# Patient Record
Sex: Male | Born: 1960
Health system: Southern US, Community
[De-identification: ages and names within clinical notes are randomized; demographics above are authoritative.]

## PROBLEM LIST (undated history)

## (undated) DIAGNOSIS — L0233 Carbuncle of buttock: Secondary | ICD-10-CM

## (undated) DIAGNOSIS — L0591 Pilonidal cyst without abscess: Secondary | ICD-10-CM

## (undated) DIAGNOSIS — F988 Other specified behavioral and emotional disorders with onset usually occurring in childhood and adolescence: Secondary | ICD-10-CM

## (undated) DIAGNOSIS — J45909 Unspecified asthma, uncomplicated: Secondary | ICD-10-CM

## (undated) DIAGNOSIS — Z87442 Personal history of urinary calculi: Secondary | ICD-10-CM

## (undated) DIAGNOSIS — R1013 Epigastric pain: Secondary | ICD-10-CM

## (undated) DIAGNOSIS — Z8679 Personal history of other diseases of the circulatory system: Secondary | ICD-10-CM

## (undated) DIAGNOSIS — L723 Sebaceous cyst: Secondary | ICD-10-CM

## (undated) DIAGNOSIS — K3189 Other diseases of stomach and duodenum: Secondary | ICD-10-CM

## (undated) DIAGNOSIS — M109 Gout, unspecified: Secondary | ICD-10-CM

## (undated) DIAGNOSIS — J309 Allergic rhinitis, unspecified: Secondary | ICD-10-CM

## (undated) DIAGNOSIS — I10 Essential (primary) hypertension: Secondary | ICD-10-CM

## (undated) DIAGNOSIS — J069 Acute upper respiratory infection, unspecified: Secondary | ICD-10-CM

## (undated) HISTORY — DX: Acute upper respiratory infection, unspecified: J06.9

## (undated) HISTORY — DX: Carbuncle of buttock: L02.33

## (undated) HISTORY — PX: OTHER SURGICAL HISTORY: SHX169

## (undated) HISTORY — DX: Personal history of other diseases of the circulatory system: Z86.79

## (undated) HISTORY — DX: Epigastric pain: R10.13

## (undated) HISTORY — DX: Allergic rhinitis, unspecified: J30.9

## (undated) HISTORY — DX: Gout, unspecified: M10.9

## (undated) HISTORY — PX: QUADRICEPS TENDON REPAIR: SHX756

## (undated) HISTORY — DX: Other specified behavioral and emotional disorders with onset usually occurring in childhood and adolescence: F98.8

## (undated) HISTORY — DX: Unspecified asthma, uncomplicated: J45.909

## (undated) HISTORY — DX: Personal history of urinary calculi: Z87.442

## (undated) HISTORY — DX: Essential (primary) hypertension: I10

## (undated) HISTORY — DX: Pilonidal cyst without abscess: L05.91

## (undated) HISTORY — DX: Other diseases of stomach and duodenum: K31.89

## (undated) HISTORY — DX: Sebaceous cyst: L72.3

---

## 1999-10-07 ENCOUNTER — Encounter (INDEPENDENT_AMBULATORY_CARE_PROVIDER_SITE_OTHER): Payer: Self-pay | Admitting: Specialist

## 1999-10-07 ENCOUNTER — Other Ambulatory Visit: Admission: RE | Admit: 1999-10-07 | Discharge: 1999-10-07 | Payer: Self-pay | Admitting: *Deleted

## 2001-03-29 ENCOUNTER — Ambulatory Visit (HOSPITAL_COMMUNITY): Admission: RE | Admit: 2001-03-29 | Discharge: 2001-03-29 | Payer: Self-pay | Admitting: Urology

## 2001-03-29 ENCOUNTER — Encounter: Payer: Self-pay | Admitting: Urology

## 2001-04-09 ENCOUNTER — Ambulatory Visit (HOSPITAL_COMMUNITY): Admission: RE | Admit: 2001-04-09 | Discharge: 2001-04-09 | Payer: Self-pay | Admitting: Urology

## 2001-06-11 ENCOUNTER — Emergency Department (HOSPITAL_COMMUNITY): Admission: EM | Admit: 2001-06-11 | Discharge: 2001-06-11 | Payer: Self-pay | Admitting: Emergency Medicine

## 2002-10-06 ENCOUNTER — Encounter: Payer: Self-pay | Admitting: Orthopedic Surgery

## 2002-10-06 ENCOUNTER — Emergency Department (HOSPITAL_COMMUNITY): Admission: EM | Admit: 2002-10-06 | Discharge: 2002-10-06 | Payer: Self-pay | Admitting: Emergency Medicine

## 2002-10-06 ENCOUNTER — Encounter: Payer: Self-pay | Admitting: Emergency Medicine

## 2004-02-27 ENCOUNTER — Ambulatory Visit: Payer: Self-pay | Admitting: Family Medicine

## 2004-03-29 ENCOUNTER — Ambulatory Visit: Payer: Self-pay | Admitting: Family Medicine

## 2004-04-12 ENCOUNTER — Ambulatory Visit: Payer: Self-pay | Admitting: Family Medicine

## 2004-07-28 ENCOUNTER — Emergency Department (HOSPITAL_COMMUNITY): Admission: EM | Admit: 2004-07-28 | Discharge: 2004-07-28 | Payer: Self-pay | Admitting: Emergency Medicine

## 2004-07-29 ENCOUNTER — Ambulatory Visit (HOSPITAL_BASED_OUTPATIENT_CLINIC_OR_DEPARTMENT_OTHER): Admission: RE | Admit: 2004-07-29 | Discharge: 2004-07-29 | Payer: Self-pay | Admitting: Urology

## 2005-07-22 ENCOUNTER — Ambulatory Visit: Payer: Self-pay | Admitting: Family Medicine

## 2005-07-27 ENCOUNTER — Ambulatory Visit: Payer: Self-pay | Admitting: Family Medicine

## 2006-09-11 DIAGNOSIS — J309 Allergic rhinitis, unspecified: Secondary | ICD-10-CM | POA: Insufficient documentation

## 2006-09-11 DIAGNOSIS — M109 Gout, unspecified: Secondary | ICD-10-CM | POA: Insufficient documentation

## 2006-09-11 DIAGNOSIS — I10 Essential (primary) hypertension: Secondary | ICD-10-CM

## 2006-09-11 HISTORY — DX: Gout, unspecified: M10.9

## 2006-09-11 HISTORY — DX: Allergic rhinitis, unspecified: J30.9

## 2006-09-11 HISTORY — DX: Essential (primary) hypertension: I10

## 2006-09-28 DIAGNOSIS — L723 Sebaceous cyst: Secondary | ICD-10-CM

## 2006-09-28 HISTORY — DX: Sebaceous cyst: L72.3

## 2006-10-02 ENCOUNTER — Ambulatory Visit: Payer: Self-pay | Admitting: Family Medicine

## 2006-10-12 ENCOUNTER — Ambulatory Visit: Payer: Self-pay | Admitting: Family Medicine

## 2006-10-12 LAB — CONVERTED CEMR LAB
Alkaline Phosphatase: 67 units/L (ref 39–117)
BUN: 23 mg/dL (ref 6–23)
Basophils Absolute: 0 10*3/uL (ref 0.0–0.1)
Basophils Relative: 0.1 % (ref 0.0–1.0)
Bilirubin Urine: NEGATIVE
CO2: 31 meq/L (ref 19–32)
Cholesterol: 160 mg/dL (ref 0–200)
Creatinine, Ser: 1.2 mg/dL (ref 0.4–1.5)
GFR calc Af Amer: 84 mL/min
Glucose, Urine, Semiquant: NEGATIVE
HCT: 42.9 % (ref 39.0–52.0)
HDL: 34.8 mg/dL — ABNORMAL LOW (ref >39.0)
Hemoglobin: 15.2 g/dL (ref 13.0–17.0)
Ketones, urine, test strip: NEGATIVE
Lymphocytes Relative: 22.6 % (ref 12.0–46.0)
MCHC: 35.5 g/dL (ref 30.0–36.0)
Monocytes Absolute: 0.5 10*3/uL (ref 0.2–0.7)
Monocytes Relative: 7.3 % (ref 3.0–11.0)
Neutro Abs: 4.6 10*3/uL (ref 1.4–7.7)
Neutrophils Relative %: 66.7 % (ref 43.0–77.0)
Nitrite: NEGATIVE
Potassium: 4.9 meq/L (ref 3.5–5.1)
Protein, U semiquant: NEGATIVE
RDW: 11.7 % (ref 11.5–14.6)
Sodium: 141 meq/L (ref 135–145)
Specific Gravity, Urine: 1.025
TSH: 0.98 microintl units/mL (ref 0.35–5.50)
Total Bilirubin: 2.1 mg/dL — ABNORMAL HIGH (ref 0.3–1.2)
Total Protein: 6.6 g/dL (ref 6.0–8.3)
Urobilinogen, UA: 0.2
WBC Urine, dipstick: NEGATIVE
pH: 5.5

## 2006-11-20 DIAGNOSIS — L0233 Carbuncle of buttock: Secondary | ICD-10-CM | POA: Insufficient documentation

## 2006-11-20 HISTORY — DX: Carbuncle of buttock: L02.33

## 2006-11-27 ENCOUNTER — Ambulatory Visit: Payer: Self-pay | Admitting: Family Medicine

## 2007-12-24 ENCOUNTER — Telehealth: Payer: Self-pay | Admitting: Family Medicine

## 2008-01-05 DIAGNOSIS — J069 Acute upper respiratory infection, unspecified: Secondary | ICD-10-CM | POA: Insufficient documentation

## 2008-01-05 HISTORY — DX: Acute upper respiratory infection, unspecified: J06.9

## 2008-01-10 ENCOUNTER — Ambulatory Visit: Payer: Self-pay | Admitting: Family Medicine

## 2008-01-10 DIAGNOSIS — J45909 Unspecified asthma, uncomplicated: Secondary | ICD-10-CM | POA: Insufficient documentation

## 2008-01-10 HISTORY — DX: Unspecified asthma, uncomplicated: J45.909

## 2008-01-24 ENCOUNTER — Ambulatory Visit: Payer: Self-pay | Admitting: Family Medicine

## 2008-01-24 LAB — CONVERTED CEMR LAB
ALT: 34 units/L (ref 0–53)
AST: 24 units/L (ref 0–37)
Albumin: 4 g/dL (ref 3.5–5.2)
BUN: 29 mg/dL — ABNORMAL HIGH (ref 6–23)
Basophils Relative: 0.1 % (ref 0.0–3.0)
Bilirubin Urine: NEGATIVE
Calcium: 10.1 mg/dL (ref 8.4–10.5)
Chloride: 100 meq/L (ref 96–112)
Cholesterol: 195 mg/dL (ref 0–200)
Creatinine, Ser: 1.3 mg/dL (ref 0.4–1.5)
Eosinophils Absolute: 0 10*3/uL (ref 0.0–0.7)
Eosinophils Relative: 0.1 % (ref 0.0–5.0)
GFR calc non Af Amer: 63 mL/min
Glucose, Urine, Semiquant: NEGATIVE
HCT: 44.9 % (ref 39.0–52.0)
Hemoglobin: 15.7 g/dL (ref 13.0–17.0)
MCHC: 34.9 g/dL (ref 30.0–36.0)
MCV: 92.2 fL (ref 78.0–100.0)
Neutro Abs: 8.8 10*3/uL — ABNORMAL HIGH (ref 1.4–7.7)
Neutrophils Relative %: 80.4 % — ABNORMAL HIGH (ref 43.0–77.0)
RBC: 4.87 M/uL (ref 4.22–5.81)
Specific Gravity, Urine: 1.02
TSH: 0.66 microintl units/mL (ref 0.35–5.50)
WBC Urine, dipstick: NEGATIVE
WBC: 10.9 10*3/uL — ABNORMAL HIGH (ref 4.5–10.5)
pH: 5.5

## 2008-01-31 ENCOUNTER — Ambulatory Visit: Payer: Self-pay | Admitting: Family Medicine

## 2008-01-31 ENCOUNTER — Telehealth: Payer: Self-pay | Admitting: Family Medicine

## 2008-01-31 DIAGNOSIS — L0591 Pilonidal cyst without abscess: Secondary | ICD-10-CM | POA: Insufficient documentation

## 2008-01-31 DIAGNOSIS — Z87442 Personal history of urinary calculi: Secondary | ICD-10-CM

## 2008-01-31 HISTORY — DX: Pilonidal cyst without abscess: L05.91

## 2008-01-31 HISTORY — DX: Personal history of urinary calculi: Z87.442

## 2008-03-20 ENCOUNTER — Observation Stay (HOSPITAL_COMMUNITY): Admission: EM | Admit: 2008-03-20 | Discharge: 2008-03-20 | Payer: Self-pay | Admitting: Emergency Medicine

## 2008-03-21 ENCOUNTER — Ambulatory Visit: Payer: Self-pay | Admitting: Internal Medicine

## 2008-03-21 DIAGNOSIS — Z8679 Personal history of other diseases of the circulatory system: Secondary | ICD-10-CM | POA: Insufficient documentation

## 2008-03-21 DIAGNOSIS — K3189 Other diseases of stomach and duodenum: Secondary | ICD-10-CM

## 2008-03-21 DIAGNOSIS — R1013 Epigastric pain: Secondary | ICD-10-CM

## 2008-03-21 HISTORY — DX: Personal history of other diseases of the circulatory system: Z86.79

## 2008-03-21 HISTORY — DX: Other diseases of stomach and duodenum: K31.89

## 2008-03-24 ENCOUNTER — Telehealth (INDEPENDENT_AMBULATORY_CARE_PROVIDER_SITE_OTHER): Payer: Self-pay

## 2008-03-25 ENCOUNTER — Ambulatory Visit: Payer: Self-pay

## 2008-03-25 ENCOUNTER — Encounter: Payer: Self-pay | Admitting: Cardiology

## 2008-10-14 ENCOUNTER — Telehealth: Payer: Self-pay | Admitting: Family Medicine

## 2008-11-05 ENCOUNTER — Telehealth: Payer: Self-pay | Admitting: Family Medicine

## 2008-12-09 ENCOUNTER — Ambulatory Visit: Payer: Self-pay | Admitting: Family Medicine

## 2008-12-09 DIAGNOSIS — F988 Other specified behavioral and emotional disorders with onset usually occurring in childhood and adolescence: Secondary | ICD-10-CM

## 2008-12-09 HISTORY — DX: Other specified behavioral and emotional disorders with onset usually occurring in childhood and adolescence: F98.8

## 2009-02-24 ENCOUNTER — Telehealth: Payer: Self-pay | Admitting: Family Medicine

## 2009-11-03 ENCOUNTER — Telehealth: Payer: Self-pay | Admitting: Family Medicine

## 2009-12-08 ENCOUNTER — Ambulatory Visit: Payer: Self-pay | Admitting: Family Medicine

## 2009-12-08 LAB — CONVERTED CEMR LAB
Albumin: 3.6 g/dL (ref 3.5–5.2)
Alkaline Phosphatase: 65 units/L (ref 39–117)
Basophils Relative: 0.9 % (ref 0.0–3.0)
CO2: 26 meq/L (ref 19–32)
Chloride: 106 meq/L (ref 96–112)
Eosinophils Absolute: 0.3 10*3/uL (ref 0.0–0.7)
Glucose, Urine, Semiquant: NEGATIVE
HCT: 42.8 % (ref 39.0–52.0)
Hemoglobin: 14.9 g/dL (ref 13.0–17.0)
Ketones, urine, test strip: NEGATIVE
MCHC: 34.8 g/dL (ref 30.0–36.0)
MCV: 92.2 fL (ref 78.0–100.0)
Monocytes Absolute: 0.3 10*3/uL (ref 0.1–1.0)
Neutro Abs: 2 10*3/uL (ref 1.4–7.7)
Nitrite: NEGATIVE
RBC: 4.64 M/uL (ref 4.22–5.81)
Sodium: 140 meq/L (ref 135–145)
Specific Gravity, Urine: >=1.03
Total CHOL/HDL Ratio: 4
Total Protein: 6 g/dL (ref 6.0–8.3)
WBC Urine, dipstick: NEGATIVE

## 2009-12-15 ENCOUNTER — Ambulatory Visit: Payer: Self-pay | Admitting: Family Medicine

## 2009-12-15 ENCOUNTER — Encounter: Payer: Self-pay | Admitting: Family Medicine

## 2010-02-18 NOTE — Progress Notes (Signed)
Summary: Zachary Gutierrez req script for Ritalin  Phone Note Call from Patient Call back at Work Phone [phone] Call back at [phone] cell   Caller: Patient Summary of Call: Zachary Gutierrez called and said that the trial of Ritalin has really worked and Zachary Gutierrez would like Dr. Tawanna Cooler to write a script for the med. Please call when script is ready for pick up.   Initial call taken by: Lucy Antigua,  February 24, 2009 8:16 AM  Follow-up for Phone Call        ok x 3 mo. Follow-up by: Roderick Pee MD,  February 24, 2009 9:24 AM    New/Updated Medications: RITALIN 10 MG TABS (METHYLPHENIDATE HCL) take one tab two times a day  fill in one month RITALIN 10 MG TABS (METHYLPHENIDATE HCL) take one tab two times a day   fill in two months Prescriptions: RITALIN 10 MG TABS (METHYLPHENIDATE HCL) take one tab two times a day   fill in two months  #60 x 0   Entered by:   Kern Reap CMA (AAMA)   Authorized by:   Roderick Pee MD   Signed by:   Kern Reap CMA (AAMA) on 02/24/2009   Method used:   Print then Give to Patient   RxID:   [id number] RITALIN 10 MG TABS (METHYLPHENIDATE HCL) take one tab two times a day  fill in one month  #60 x 0   Entered by:   Kern Reap CMA (AAMA)   Authorized by:   Roderick Pee MD   Signed by:   Kern Reap CMA (AAMA) on 02/24/2009   Method used:   Print then Give to Patient   RxID:   [id number] RITALIN 10 MG TABS (METHYLPHENIDATE HCL) Take 1 tablet by mouth two times a day  #60 x 0   Entered by:   Kern Reap CMA (AAMA)   Authorized by:   Roderick Pee MD   Signed by:   Kern Reap CMA (AAMA) on 02/24/2009   Method used:   Print then Give to Patient   RxID:   [id number]

## 2010-02-18 NOTE — Progress Notes (Signed)
Summary: Rx Refill  Phone Note Refill Request Message from:  Patient on November 03, 2009 3:19 PM  Refills Requested: Medication #1:  HYZAAR 50-12.5 MG TABS take one tablet in the morning  Medication #2:  RITALIN 10 MG TABS Take 1 tablet by mouth two times a day Pt will pick up ritalin when available, please send Hyzaar to [facility].   Method Requested: Electronic Initial call taken by: Trixie Dredge,  November 03, 2009 3:19 PM    Prescriptions: RITALIN 10 MG TABS (METHYLPHENIDATE HCL) take one tab two times a day   fill in two months  #60 x 0   Entered by:   Kern Reap CMA (AAMA)   Authorized by:   Roderick Pee MD   Signed by:   Kern Reap CMA (AAMA) on 11/04/2009   Method used:   Print then Give to Patient   RxID:   [id number] RITALIN 10 MG TABS (METHYLPHENIDATE HCL) take one tab two times a day  fill in one month  #60 x 0   Entered by:   Kern Reap CMA (AAMA)   Authorized by:   Roderick Pee MD   Signed by:   Kern Reap CMA (AAMA) on 11/04/2009   Method used:   Print then Give to Patient   RxID:   [id number] RITALIN 10 MG TABS (METHYLPHENIDATE HCL) Take 1 tablet by mouth two times a day  #60 x 0   Entered by:   Kern Reap CMA (AAMA)   Authorized by:   Roderick Pee MD   Signed by:   Kern Reap CMA (AAMA) on 11/04/2009   Method used:   Print then Give to Patient   RxID:   [id number] WNUUVO 50-12.5 MG TABS (LOSARTAN POTASSIUM-HCTZ) take one tablet in the morning  #100 x 2   Entered by:   Kern Reap CMA (AAMA)   Authorized by:   Roderick Pee MD   Signed by:   Kern Reap CMA (AAMA) on 11/04/2009   Method used:   Electronically to        [facility] [phone]* (retail)       [address]       [place], [place]  [address]       Ph: [phone] or [phone]       Fax: [phone]   RxID:   [id number]

## 2010-02-18 NOTE — Assessment & Plan Note (Signed)
Summary: CPX//SLM   Vital Signs:  Patient profile:   50 year old male Height:      64.25 inches Weight:      152 pounds BMI:     25.98 Temp:     98.7 degrees F oral BP sitting:   126 / 86  (left arm) Cuff size:   regular  Vitals Entered By: Kern Reap CMA Duncan Dull) (December 15, 2009 9:41 AM) CC: cpx   CC:  cpx.  History of Present Illness: Zachary Gutierrez is a 50 year old, married male, nonsmoker, attorney, who comes in today for general physical examination because of a history of underlying gout, allergic rhinitis, hypertension, and mild ADD.  He takes an 81-mg baby aspirin daily and is not taking his allopurinol.  He has a history of elevated uric acid and kidney stones.  Advised to take the allopurinol daily.  He takes Allegra for allergic rhinitis.  Hyzaar 50 -- 12.5 daily for hypertension.  BP 126/86.  He also takes Ritalin 10 mg b.i.d. for mild ADDNS>  Re.  He gets routine eye care and dental care.  Tetanus 2004 seasonal flu shot 2010.  He continues to remain physically active, playing soccer  Allergies: No Known Drug Allergies  Past History:  Past medical, surgical, family and social histories (including risk factors) reviewed, and no changes noted (except as noted below).  Past Medical History: Reviewed history from 01/31/2008 and no changes required. Allergic rhinitis Gout Hypertension Nephrolithiasis, hx of  Past Surgical History: Reviewed history from 09/11/2006 and no changes required. nephrolithiasis  Family History: Reviewed history from 11/27/2006 and no changes required. Family History Hypertension  Social History: Reviewed history from 11/27/2006 and no changes required. Occupation:  Clinical research associate sports  Married Never Smoked Alcohol use-yes Drug use-no Regular exercise-yes  Review of Systems      See HPI  Physical Exam  General:  Well-developed,well-nourished,in no acute distress; alert,appropriate and cooperative throughout examination Head:   Normocephalic and atraumatic without obvious abnormalities. No apparent alopecia or balding. Eyes:  No corneal or conjunctival inflammation noted. EOMI. Perrla. Funduscopic exam benign, without hemorrhages, exudates or papilledema. Vision grossly normal. Ears:  External ear exam shows no significant lesions or deformities.  Otoscopic examination reveals clear canals, tympanic membranes are intact bilaterally without bulging, retraction, inflammation or discharge. Hearing is grossly normal bilaterally. Nose:  External nasal examination shows no deformity or inflammation. Nasal mucosa are pink and moist without lesions or exudates. Mouth:  Oral mucosa and oropharynx without lesions or exudates.  Teeth in good repair. Neck:  No deformities, masses, or tenderness noted. Chest Wall:  No deformities, masses, tenderness or gynecomastia noted. Breasts:  No masses or gynecomastia noted Lungs:  Normal respiratory effort, chest expands symmetrically. Lungs are clear to auscultation, no crackles or wheezes. Heart:  Normal rate and regular rhythm. S1 and S2 normal without gallop, murmur, click, rub or other extra sounds. Abdomen:  Bowel sounds positive,abdomen soft and non-tender without masses, organomegaly or hernias noted. Rectal:  No external abnormalities noted. Normal sphincter tone. No rectal masses or tenderness. Genitalia:  Testes bilaterally descended without nodularity, tenderness or masses. No scrotal masses or lesions. No penis lesions or urethral discharge. Prostate:  Prostate gland firm and smooth, no enlargement, nodularity, tenderness, mass, asymmetry or induration. Msk:  No deformity or scoliosis noted of thoracic or lumbar spine.   Pulses:  R and L carotid,radial,femoral,dorsalis pedis and posterior tibial pulses are full and equal bilaterally Extremities:  No clubbing, cyanosis, edema, or deformity noted with normal  full range of motion of all joints.   Neurologic:  No cranial nerve deficits  noted. Station and gait are normal. Plantar reflexes are down-going bilaterally. DTRs are symmetrical throughout. Sensory, motor and coordinative functions appear intact. Skin:  Intact without suspicious lesions or rashes Cervical Nodes:  No lymphadenopathy noted Axillary Nodes:  No palpable lymphadenopathy Inguinal Nodes:  No significant adenopathy Psych:  Cognition and judgment appear intact. Alert and cooperative with normal attention span and concentration. No apparent delusions, illusions, hallucinations   Impression & Recommendations:  Problem # 1:  ATTENTION DEFICIT DISORDER, ADULT (ICD-314.00) Assessment Improved  Orders: Prescription Created Electronically [phone])  Problem # 2:  HEALTH SCREENING (ICD-V70.0) Assessment: Unchanged  Orders: Prescription Created Electronically [phone]) EKG w/ Interpretation (93000)  Problem # 3:  HYPERTENSION (ICD-401.9) Assessment: Improved  His updated medication list for this problem includes:    Hyzaar 50-12.5 Mg Tabs (Losartan potassium-hctz) .Marland Kitchen... Take one tablet in the morning  Orders: Prescription Created Electronically [phone]) EKG w/ Interpretation (93000)  Problem # 4:  GOUT (ICD-274.9) Assessment: Improved  His updated medication list for this problem includes:    Allopurinol 300 Mg Tabs (Allopurinol) .Marland Kitchen... Take one every morning  Orders: Prescription Created Electronically [phone])  Complete Medication List: 1)  Aspirin 81 Mg Tbec (Aspirin) .... Once daily 2)  Allopurinol 300 Mg Tabs (Allopurinol) .... Take one every morning 3)  Fexofenadine Hcl 180 Mg Tabs (Fexofenadine hcl) .... As needed 4)  Hyzaar 50-12.5 Mg Tabs (Losartan potassium-hctz) .... Take one tablet in the morning 5)  Ritalin 10 Mg Tabs (Methylphenidate hcl) .... Take 1 tablet by mouth two times a day 6)  Ritalin 10 Mg Tabs (Methylphenidate hcl) .... Take one tab two times a day  fill in one month 7)  Ritalin 10 Mg Tabs (Methylphenidate hcl) .... Take one  tab two times a day   fill in two months  Patient Instructions: 1)  Please schedule a follow-up appointment in 1 year. 2)  It is important that you exercise regularly at least 20 minutes 5 times a week. If you develop chest pain, have severe difficulty breathing, or feel very tired , stop exercising immediately and seek medical attention. 3)  Take an Aspirin every day. Prescriptions: HYZAAR 50-12.5 MG TABS (LOSARTAN POTASSIUM-HCTZ) take one tablet in the morning  #100 x 2   Entered and Authorized by:   Roderick Pee MD   Signed by:   Roderick Pee MD on 12/15/2009   Method used:   Electronically to        [facility] [phone]* (retail)       [address]       [place], [place]  [address]       Ph: [phone] or [phone]       Fax: [phone]   RxID:   [id number] ALLOPURINOL 300 MG  TABS (ALLOPURINOL) take one every morning  #100 x 3   Entered and Authorized by:   Roderick Pee MD   Signed by:   Roderick Pee MD on 12/15/2009   Method used:   Electronically to        [facility] [address] [phone]* (retail)       [address]       [place], [place]  [address]       Ph: [phone] or [phone]  Fax: [phone]   RxID:   [id number]    Orders Added: 1)  Prescription Created Electronically [G8553] 2)  Est. Patient 40-64 years [84696] 3)  EKG w/ Interpretation [93000]

## 2010-04-29 LAB — DIFFERENTIAL
Eosinophils Relative: 3 % (ref 0–5)
Lymphocytes Relative: 36 % (ref 12–46)
Lymphs Abs: 2.3 10*3/uL (ref 0.7–4.0)
Monocytes Relative: 8 % (ref 3–12)

## 2010-04-29 LAB — URINE MICROSCOPIC-ADD ON

## 2010-04-29 LAB — CBC
HCT: 42.5 % (ref 39.0–52.0)
Hemoglobin: 14.9 g/dL (ref 13.0–17.0)
MCHC: 35 g/dL (ref 30.0–36.0)
MCV: 91.9 fL (ref 78.0–100.0)
RDW: 12.6 % (ref 11.5–15.5)

## 2010-04-29 LAB — TROPONIN I
Troponin I: <0.01 ng/mL (ref 0.00–0.06)
Troponin I: <0.01 ng/mL (ref 0.00–0.06)

## 2010-04-29 LAB — CK TOTAL AND CKMB (NOT AT ARMC)
Relative Index: 1.1 (ref 0.0–2.5)
Relative Index: 1.1 (ref 0.0–2.5)

## 2010-04-29 LAB — URINALYSIS, ROUTINE W REFLEX MICROSCOPIC
Protein, ur: NEGATIVE mg/dL
Urobilinogen, UA: 0.2 mg/dL (ref 0.0–1.0)

## 2010-04-29 LAB — BASIC METABOLIC PANEL
CO2: 22 mEq/L (ref 19–32)
Chloride: 104 mEq/L (ref 96–112)
Glucose, Bld: 106 mg/dL — ABNORMAL HIGH (ref 70–99)
Potassium: 3.5 mEq/L (ref 3.5–5.1)
Sodium: 139 mEq/L (ref 135–145)

## 2010-06-04 NOTE — Op Note (Signed)
[facility]  Patient:    [name], Zachary Gutierrez Visit Number: [id number] MRN: [id number]          Service Type: DSU Location: DAY Attending Physician:  Lauree Chandler Dictated by:   Maretta Bees. Vonita Moss, M.D. Proc. Date: 04/09/01 Admit Date:  04/09/2001                             Operative Report  PREOPERATIVE DIAGNOSIS:  Right ureteral calculus.  POSTOPERATIVE DIAGNOSIS:  Right ureteral calculus.  PROCEDURE: 1. Cystoscopy. 2. Right ureteroscopy and stone manipulation.  SURGEON:  Maretta Bees. Vonita Moss, M.D.  ANESTHESIA:  General.  INDICATIONS:  This 50 year old white male has had two or three weeks of intermittent right flank pain that has been very severe at times. He was scheduled for lithotripsy last week after he flew back from [place] with severe pain and at the time of the lithotripsy, a KUB did not visualize the stone. We could not tell whether the stone was gone but in retrospect, it was probably over the sacroiliac area. He had the onset yesterday, March 223, of recurrent severe right flank pain, increased urge to void and a KUB in the office this morning showed the stone to be 4 mm in size in the vicinity to the distal right ureter. In view of the pain and inconvenience and persistent clinical course, he opted to go ahead with stone manipulation tonight.  DESCRIPTION OF PROCEDURE:  The patient was brought to the operating room and placed in lithotomy position. External genitalia were prepped and draped in the usual fashion. He was cystoscoped and the stone was right at the right ureteral orifice. A guidewire was placed up the right ureter without difficulty. I then inserted the ureteroscope and the stone was manipulated out of the distal ureter and subsequently given to the patient. The distal ureter was in good condition with no residual stone fragments and no concern about any harm or injury to the ureter and therefore, a Double J  catheter was not left indwelling. The guidewire was removed. The patient was taken to the recovery room in good condition having tolerated the procedure well. Dictated by:   Maretta Bees. Vonita Moss, M.D. Attending Physician:  Lauree Chandler DD:  04/09/01 TD:  04/09/01 Job: [id number]

## 2010-06-04 NOTE — Op Note (Signed)
NAME:  ARAGON, SCARANTINO               ACCOUNT NO.:  [id number]   MEDICAL RECORD NO.:  [id number]          PATIENT TYPE:  AMB   LOCATION:  [facility]                         FACILITY:  [facility]   PHYSICIAN:  Maretta Bees. Vonita Moss, M.D.DATE OF BIRTH:  1960-08-19   DATE OF PROCEDURE:  07/29/2004  DATE OF DISCHARGE:                                 OPERATIVE REPORT   PREOPERATIVE DIAGNOSIS:  Right ureteral calculus.   POSTOPERATIVE DIAGNOSIS:  Right ureteral calculus, bladder calculi.   PROCEDURE:  Cystoscopy, right retrograde pyelogram with interpretation,  right ureteroscopic stone basketing and laser fragmentation and insertion of  right double-J catheter.   SURGEON:  Dr. Larey Dresser.   ANESTHESIA:  General.   INDICATIONS:  This 50 year old gentleman with known stone disease has passed  multiple small pieces of gravel in the last few days but has had fairly  severe persistent right flank pain and a CT scan showed a 4 mm stone in the  right upper ureter. He is brought to the OR today for relief of all these  symptoms.   DESCRIPTION OF PROCEDURE:  The patient was brought to the operating room,  placed in lithotomy position. The external genitalia were prepped and draped  in the usual fashion. He was cystoscoped,  the anterior and prostatic  urethra was unremarkable. The bladder had a lot of fine yellow stony gravel  and also some right ureteral orifice on the right. There were no bladder  tumors or other lesions. The guidewire was placed up the right ureter and  would not go beyond the stone that was located at the level of L3. I then  inserted an open-ended ureteral catheter and then a Glidewire was able to be  manipulated beyond the stone and a retrograde pyelogram was obtained. Before  I slipped up the Glidewire, it showed a filling defect consistent with a  uric acid stone. He has known uric acid stone disease. With the metal  guidewire now beyond the stone, I inserted a 6-French  ureteroscope and  easily manipulated up to the stone which was somewhat impacted and too big  to basket. I then used the holmium laser and broke it into multiple small  pieces and basketed out a couple of the larger pieces, the rest were too  small to engage in the basket.   Retrograde pyelogram was then performed showing mild pyelocaliectasis and no  evidence of extravasation. The guidewire was reinserted into the ureter and  over that a 6 French 26 cm double-J catheter was placed with a full coil in  the upper pole calix and a full coil in the bladder and a string brought out  per urethra and taped to the penis. The patient was then taken to the  recovery room in good condition having tolerated the procedure well.     _________________    LJP/MEDQ  D:  07/29/2004  T:  07/29/2004  Job:  [id number]

## 2011-02-28 ENCOUNTER — Telehealth: Payer: Self-pay | Admitting: Family Medicine

## 2011-02-28 MED ORDER — LOSARTAN POTASSIUM-HCTZ 50-12.5 MG PO TABS: 1.0000 | ORAL_TABLET | Freq: Every day | ORAL | Status: DC

## 2011-02-28 NOTE — Telephone Encounter (Signed)
Pt needs a refill on his Losartan-HCTZ 50-12.5mg . He only has 1 left, but did not realize it. I made his cpx appt for 04/25/11. He uses the [facility] on [address] Thanks!

## 2011-04-13 ENCOUNTER — Other Ambulatory Visit: Payer: Self-pay

## 2011-04-18 ENCOUNTER — Encounter: Payer: Self-pay | Admitting: Family Medicine

## 2011-04-25 ENCOUNTER — Encounter: Payer: Self-pay | Admitting: Family Medicine

## 2011-05-30 ENCOUNTER — Other Ambulatory Visit (INDEPENDENT_AMBULATORY_CARE_PROVIDER_SITE_OTHER): Payer: BC Managed Care – PPO

## 2011-05-30 DIAGNOSIS — Z0000 Encounter for general adult medical examination without abnormal findings: Secondary | ICD-10-CM

## 2011-05-30 LAB — HEPATIC FUNCTION PANEL
ALT: 53 U/L (ref 0–53)
Alkaline Phosphatase: 73 U/L (ref 39–117)
Bilirubin, Direct: 0.2 mg/dL (ref 0.0–0.3)
Total Protein: 6.7 g/dL (ref 6.0–8.3)

## 2011-05-30 LAB — BASIC METABOLIC PANEL
Calcium: 8.4 mg/dL (ref 8.4–10.5)
Chloride: 99 mEq/L (ref 96–112)
Creatinine, Ser: 1.1 mg/dL (ref 0.4–1.5)

## 2011-05-30 LAB — CBC WITH DIFFERENTIAL/PLATELET
Basophils Relative: 0.7 % (ref 0.0–3.0)
Eosinophils Relative: 7.5 % — ABNORMAL HIGH (ref 0.0–5.0)
Lymphocytes Relative: 27.5 % (ref 12.0–46.0)
MCV: 92.2 fl (ref 78.0–100.0)
Neutrophils Relative %: 57 % (ref 43.0–77.0)
RBC: 4.82 Mil/uL (ref 4.22–5.81)
WBC: 4.7 10*3/uL (ref 4.5–10.5)

## 2011-05-30 LAB — POCT URINALYSIS DIPSTICK
Glucose, UA: NEGATIVE
Nitrite, UA: NEGATIVE
Urobilinogen, UA: 0.2

## 2011-05-30 LAB — LIPID PANEL
LDL Cholesterol: 99 mg/dL (ref 0–99)
Total CHOL/HDL Ratio: 3

## 2011-05-30 LAB — PSA: PSA: 0.93 ng/mL (ref 0.10–4.00)

## 2011-06-27 ENCOUNTER — Ambulatory Visit (INDEPENDENT_AMBULATORY_CARE_PROVIDER_SITE_OTHER): Payer: BC Managed Care – PPO | Admitting: Family Medicine

## 2011-06-27 ENCOUNTER — Encounter: Payer: Self-pay | Admitting: Family Medicine

## 2011-06-27 VITALS — BP 100/78 | HR 64 | Temp 98.1°F | Ht 64.0 in | Wt 152.0 lb

## 2011-06-27 DIAGNOSIS — Z0000 Encounter for general adult medical examination without abnormal findings: Secondary | ICD-10-CM

## 2011-06-27 DIAGNOSIS — J309 Allergic rhinitis, unspecified: Secondary | ICD-10-CM

## 2011-06-27 DIAGNOSIS — Z87442 Personal history of urinary calculi: Secondary | ICD-10-CM

## 2011-06-27 DIAGNOSIS — M109 Gout, unspecified: Secondary | ICD-10-CM

## 2011-06-27 DIAGNOSIS — I10 Essential (primary) hypertension: Secondary | ICD-10-CM

## 2011-06-27 MED ORDER — LOSARTAN POTASSIUM-HCTZ 50-12.5 MG PO TABS: 1.0000 | ORAL_TABLET | Freq: Every day | ORAL | Status: DC

## 2011-06-27 MED ORDER — OXYCODONE-ACETAMINOPHEN 5-325 MG PO TABS: 1.0000 | ORAL_TABLET | Freq: Three times a day (TID) | ORAL | Status: AC | PRN

## 2011-06-27 NOTE — Patient Instructions (Signed)
Continue your current medication  Followup in 1 year sooner if any problems  He will get a call from GI to begin the process of a screening colonoscopy

## 2011-06-27 NOTE — Progress Notes (Signed)
Subjective:    Patient ID: Zachary Gutierrez, male    DOB: [birth year], 51 y.o.   MRN: [id number]  HPI [name] is a 51 year old married male nonsmoker who comes in today for physical evaluation because of a history of underlying hypertension  He takes Hyzaar 50-12.5 daily BP 100 and over 78 he states his blood pressure at home is about the same.  He uses over-the-counter Allegra for allergic rhinitis and he takes an aspirin tablet daily.    Review of Systems  Constitutional: Negative.   HENT: Negative.   Eyes: Negative.   Respiratory: Negative.   Cardiovascular: Negative.   Gastrointestinal: Negative.   Genitourinary: Negative.   Musculoskeletal: Negative.   Skin: Negative.   Neurological: Negative.   Hematological: Negative.   Psychiatric/Behavioral: Negative.        Objective:   Physical Exam  Constitutional: He is oriented to person, place, and time. He appears well-developed and well-nourished.  HENT:  Head: Normocephalic and atraumatic.  Right Ear: External ear normal.  Left Ear: External ear normal.  Nose: Nose normal.  Mouth/Throat: Oropharynx is clear and moist.  Eyes: Conjunctivae and EOM are normal. Pupils are equal, round, and reactive to light.  Neck: Normal range of motion. Neck supple. No JVD present. No tracheal deviation present. No thyromegaly present.  Cardiovascular: Normal rate, regular rhythm, normal heart sounds and intact distal pulses.  Exam reveals no gallop and no friction rub.   No murmur heard. Pulmonary/Chest: Effort normal and breath sounds normal. No stridor. No respiratory distress. He has no wheezes. He has no rales. He exhibits no tenderness.  Abdominal: Soft. Bowel sounds are normal. He exhibits no distension and no mass. There is no tenderness. There is no rebound and no guarding.  Genitourinary: Rectum normal, prostate normal and penis normal. Guaiac negative stool. No penile tenderness.  Musculoskeletal: Normal range of motion. He  exhibits no edema and no tenderness.  Lymphadenopathy:    He has no cervical adenopathy.  Neurological: He is alert and oriented to person, place, and time. He has normal reflexes. No cranial nerve deficit. He exhibits normal muscle tone.  Skin: Skin is warm and dry. No rash noted. No erythema. No pallor.  Psychiatric: He has a normal mood and affect. His behavior is normal. Judgment and thought content normal.          Assessment & Plan:  Healthy male  Hypertension at goal continue Hyzaar 50-12.5 daily  Allergic rhinitis continue Allegra 180 daily plain  Screening colonoscopy for age negative family history of colon cancer or polyps

## 2011-08-29 ENCOUNTER — Encounter: Payer: Self-pay | Admitting: Gastroenterology

## 2012-04-09 ENCOUNTER — Encounter: Payer: Self-pay | Admitting: Internal Medicine

## 2012-04-09 ENCOUNTER — Ambulatory Visit (INDEPENDENT_AMBULATORY_CARE_PROVIDER_SITE_OTHER): Payer: BC Managed Care – PPO | Admitting: Internal Medicine

## 2012-04-09 VITALS — BP 140/90 | HR 72 | Temp 97.4°F | Resp 18 | Wt 158.0 lb

## 2012-04-09 DIAGNOSIS — J069 Acute upper respiratory infection, unspecified: Secondary | ICD-10-CM

## 2012-04-09 MED ORDER — HYDROCODONE-HOMATROPINE 5-1.5 MG/5ML PO SYRP: 5.0000 mL | ORAL_SOLUTION | Freq: Four times a day (QID) | ORAL | Status: AC | PRN

## 2012-04-09 NOTE — Patient Instructions (Signed)
Acute bronchitis symptoms for less than 10 days are generally not helped by antibiotics.  Take over-the-counter expectorants and cough medications such as  Mucinex DM.  Call if there is no improvement in 5 to 7 days or if he developed worsening cough, fever, or new symptoms, such as shortness of breath or chest pain.    

## 2012-04-09 NOTE — Progress Notes (Signed)
Subjective:    Patient ID: Zachary Gutierrez, male    DOB: June 27, 1960, 52 y.o.   MRN: [id number]  HPI   52 year old patient with a four-day history of headache cough sore throat and mild chest congestion. He has treated hypertension and a history of allergic rhinitis. He was concerned about serious illness since he will be leaving the state for travel stand.  Past Medical History  Diagnosis Date  . GOUT 09/11/2006    Qualifier: Diagnosis of  By: Everett Graff    . ATTENTION DEFICIT DISORDER, ADULT 12/09/2008    Qualifier: Diagnosis of  By: Tawanna Cooler MD, Eugenio Hoes   . HYPERTENSION 09/11/2006    Qualifier: Diagnosis of  By: Everett Graff    . VIRAL URI 01/05/2008    Qualifier: Diagnosis of  By: Tawanna Cooler MD, Eugenio Hoes   . ALLERGIC RHINITIS 09/11/2006    Qualifier: Diagnosis of  By: Everett Graff    . Extrinsic asthma, unspecified 01/10/2008    Qualifier: Diagnosis of  By: Tawanna Cooler MD, Eugenio Hoes DYSPEPSIA 03/21/2008    Qualifier: Diagnosis of  By: Amador Cunas  MD, Janett Labella   . Carbuncle and furuncle of buttock 11/20/2006    Qualifier: Diagnosis of  By: Tawanna Cooler MD, Eugenio Hoes   . PILONIDAL CYST 01/31/2008    Qualifier: Diagnosis of  By: Tawanna Cooler MD, Eugenio Hoes   . SEBACEOUS CYST, NECK 09/28/2006    Qualifier: Diagnosis of  By: Tawanna Cooler MD, Eugenio Hoes   . PALPITATIONS, HX OF 03/21/2008    Qualifier: Diagnosis of  By: Amador Cunas  MD, Janett Labella NEPHROLITHIASIS, HX OF 01/31/2008    Qualifier: Diagnosis of  By: Tawanna Cooler MD, Eugenio Hoes     History   Social History  . Marital Status: Married    Spouse Name: N/A    Number of Children: N/A  . Years of Education: N/A   Occupational History  . Not on file.   Social History Main Topics  . Smoking status: Never Smoker   . Smokeless tobacco: Never Used  . Alcohol Use: No  . Drug Use: No  . Sexually Active: Not on file   Other Topics Concern  . Not on file   Social History Narrative  . No narrative on file    Past Surgical History  Procedure Laterality  Date  . Nephrolithiasis      Family History  Problem Relation Age of Onset  . Hypertension Other     No Known Allergies  Current Outpatient Prescriptions on File Prior to Visit  Medication Sig Dispense Refill  . aspirin 81 MG tablet Take 81 mg by mouth daily.      . fexofenadine (ALLEGRA) 180 MG tablet Take 180 mg by mouth daily.      Marland Kitchen losartan-hydrochlorothiazide (HYZAAR) 50-12.5 MG per tablet Take 1 tablet by mouth daily.  100 tablet  3   No current facility-administered medications on file prior to visit.    BP 140/90  Pulse 72  Temp(Src) 97.4 F (36.3 C) (Oral)  Resp 18  Wt 158 lb (71.668 kg)  BMI 27.11 kg/m2  SpO2 97%      Review of Systems  Constitutional: Positive for fatigue. Negative for fever.  HENT: Positive for sore throat, voice change, postnasal drip and sinus pressure.   Respiratory: Positive for cough.        Objective:   Physical Exam  Constitutional: He is oriented to person, place, and time. He appears  well-developed.  HENT:  Head: Normocephalic.  Right Ear: External ear normal.  Left Ear: External ear normal.  Eyes: Conjunctivae and EOM are normal.  Neck: Normal range of motion.  Cardiovascular: Normal rate and normal heart sounds.   Pulmonary/Chest: Breath sounds normal.  Abdominal: Bowel sounds are normal.  Musculoskeletal: Normal range of motion. He exhibits no edema and no tenderness.  Neurological: He is alert and oriented to person, place, and time.  Psychiatric: He has a normal mood and affect. His behavior is normal.          Assessment & Plan:   Viral URI with cough. We'll treat symptomatically  Hypertension stable. Repeat blood pressure 130/80 to

## 2012-08-03 ENCOUNTER — Ambulatory Visit (INDEPENDENT_AMBULATORY_CARE_PROVIDER_SITE_OTHER): Payer: BC Managed Care – PPO | Admitting: Internal Medicine

## 2012-08-03 ENCOUNTER — Encounter: Payer: Self-pay | Admitting: Internal Medicine

## 2012-08-03 VITALS — BP 138/96 | HR 58 | Temp 97.9°F | Ht 64.0 in | Wt 156.0 lb

## 2012-08-03 DIAGNOSIS — L03012 Cellulitis of left finger: Secondary | ICD-10-CM

## 2012-08-03 DIAGNOSIS — L03019 Cellulitis of unspecified finger: Secondary | ICD-10-CM

## 2012-08-03 MED ORDER — TRAMADOL HCL 50 MG PO TABS: 50.0000 mg | ORAL_TABLET | Freq: Three times a day (TID) | ORAL | Status: DC | PRN

## 2012-08-03 MED ORDER — SULFAMETHOXAZOLE-TMP DS 800-160 MG PO TABS: 1.0000 | ORAL_TABLET | Freq: Two times a day (BID) | ORAL | Status: DC

## 2012-08-03 NOTE — Patient Instructions (Signed)
Paronychia Paronychia is an inflammatory reaction involving the folds of the skin surrounding the fingernail. This is commonly caused by an infection in the skin around a nail. The most common cause of paronychia is frequent wetting of the hands (as seen with bartenders, food servers, nurses or others who wet their hands). This makes the skin around the fingernail susceptible to infection by bacteria (germs) or fungus. Other predisposing factors are:  Aggressive manicuring.  Nail biting.  Thumb sucking. The most common cause is a staphylococcal (a type of germ) infection, or a fungal (Candida) infection. When caused by a germ, it usually comes on suddenly with redness, swelling, pus and is often painful. It may get under the nail and form an abscess (collection of pus), or form an abscess around the nail. If the nail itself is infected with a fungus, the treatment is usually prolonged and may require oral medicine for up to one year. Your caregiver will determine the length of time treatment is required. The paronychia caused by bacteria (germs) may largely be avoided by not pulling on hangnails or picking at cuticles. When the infection occurs at the tips of the finger it is called felon. When the cause of paronychia is from the herpes simplex virus (HSV) it is called herpetic whitlow. TREATMENT  When an abscess is present treatment is often incision and drainage. This means that the abscess must be cut open so the pus can get out. When this is done, the following home care instructions should be followed. HOME CARE INSTRUCTIONS   It is important to keep the affected fingers very dry. Rubber or plastic gloves over cotton gloves should be used whenever the hand must be placed in water.  Keep wound clean, dry and dressed as suggested by your caregiver between warm soaks or warm compresses.  Soak in warm water for fifteen to twenty minutes three to four times per day for bacterial infections. Fungal  infections are very difficult to treat, so often require treatment for long periods of time.  For bacterial (germ) infections take antibiotics (medicine which kill germs) as directed and finish the prescription, even if the problem appears to be solved before the medicine is gone.  Only take over-the-counter or prescription medicines for pain, discomfort, or fever as directed by your caregiver. SEEK IMMEDIATE MEDICAL CARE IF:  You have redness, swelling, or increasing pain in the wound.  You notice pus coming from the wound.  You have a fever.  You notice a bad smell coming from the wound or dressing. Document Released: 06/29/2000 Document Revised: 03/28/2011 Document Reviewed: 02/29/2008 Exit[employer] Patient Information 2014 [employer].  

## 2012-08-03 NOTE — Progress Notes (Signed)
Subjective:    Patient ID: Zachary Gutierrez, male    DOB: 05/26/1960, 52 y.o.   MRN: [id number]  HPI  Pt presents to the clinic today with c/o pain and infection in his left thumb. This started 4 days ago. He went to urgent care to have it looked at. They put him on Diclofenac and azithromax. The swelling, pain, and redness have gotten worse. He thinks the medications is not working. He has not seen it drained. He has been clipping his nails. He does not bite them. He denies fever, chill or body aches.  Review of Systems      Past Medical History  Diagnosis Date  . GOUT 09/11/2006    Qualifier: Diagnosis of  By: Everett Graff    . ATTENTION DEFICIT DISORDER, ADULT 12/09/2008    Qualifier: Diagnosis of  By: Tawanna Cooler MD, Eugenio Hoes   . HYPERTENSION 09/11/2006    Qualifier: Diagnosis of  By: Everett Graff    . VIRAL URI 01/05/2008    Qualifier: Diagnosis of  By: Tawanna Cooler MD, Eugenio Hoes   . ALLERGIC RHINITIS 09/11/2006    Qualifier: Diagnosis of  By: Everett Graff    . Extrinsic asthma, unspecified 01/10/2008    Qualifier: Diagnosis of  By: Tawanna Cooler MD, Eugenio Hoes DYSPEPSIA 03/21/2008    Qualifier: Diagnosis of  By: Amador Cunas  MD, Janett Labella   . Carbuncle and furuncle of buttock 11/20/2006    Qualifier: Diagnosis of  By: Tawanna Cooler MD, Eugenio Hoes   . PILONIDAL CYST 01/31/2008    Qualifier: Diagnosis of  By: Tawanna Cooler MD, Eugenio Hoes   . SEBACEOUS CYST, NECK 09/28/2006    Qualifier: Diagnosis of  By: Tawanna Cooler MD, Eugenio Hoes   . PALPITATIONS, HX OF 03/21/2008    Qualifier: Diagnosis of  By: Amador Cunas  MD, Janett Labella NEPHROLITHIASIS, HX OF 01/31/2008    Qualifier: Diagnosis of  By: Tawanna Cooler MD, Eugenio Hoes     Current Outpatient Prescriptions  Medication Sig Dispense Refill  . aspirin 81 MG tablet Take 81 mg by mouth daily.      . fexofenadine (ALLEGRA) 180 MG tablet Take 180 mg by mouth daily.      Marland Kitchen losartan-hydrochlorothiazide (HYZAAR) 50-12.5 MG per tablet Take 1 tablet by mouth daily.  100 tablet  3   No  current facility-administered medications for this visit.    No Known Allergies  Family History  Problem Relation Age of Onset  . Hypertension Other     History   Social History  . Marital Status: Married    Spouse Name: N/A    Number of Children: N/A  . Years of Education: N/A   Occupational History  . Not on file.   Social History Main Topics  . Smoking status: Never Smoker   . Smokeless tobacco: Never Used  . Alcohol Use: No  . Drug Use: No  . Sexually Active: Not on file   Other Topics Concern  . Not on file   Social History Narrative  . No narrative on file     Constitutional: Denies fever, malaise, fatigue, headache or abrupt weight changes.  Skin: Pt reports pain and infections in left thumb. Denies rashes, lesions or ulcercations.    No other specific complaints in a complete review of systems (except as listed in HPI above).  Objective:   Physical Exam   BP 138/96  Pulse 58  Temp(Src) 97.9 F (36.6 C) (Oral)  Ht  5\' 4"  (1.626 m)  Wt 156 lb (70.761 kg)  BMI 26.76 kg/m2  SpO2 96% Wt Readings from Last 3 Encounters:  08/03/12 156 lb (70.761 kg)  04/09/12 158 lb (71.668 kg)  06/27/11 152 lb (68.947 kg)    General: Appears their stated age, well developed, well nourished in NAD. Skin: Warm, dry and intact. Large paronychia of left thumb, with surrounding erythema, obviously infected.  Cardiovascular: Normal rate and rhythm. S1,S2 noted.  No murmur, rubs or gallops noted. No JVD or BLE edema. No carotid bruits noted. Pulmonary/Chest: Normal effort and positive vesicular breath sounds. No respiratory distress. No wheezes, rales or ronchi noted.    EKG:  BMET    Component Value Date/Time   NA 138 05/30/2011 0850   K 3.6 05/30/2011 0850   CL 99 05/30/2011 0850   CO2 25 05/30/2011 0850   GLUCOSE 97 05/30/2011 0850   BUN 20 05/30/2011 0850   CREATININE 1.1 05/30/2011 0850   CALCIUM 8.4 05/30/2011 0850   GFRNONAA 72.45 12/08/2009 0842   GFRAA   Value: >60        The eGFR has been calculated using the MDRD equation. This calculation has not been validated in all clinical situations. eGFR's persistently <60 mL/min signify possible Chronic Kidney Disease. 03/20/2008 0013    Lipid Panel     Component Value Date/Time   CHOL 160 05/30/2011 0850   TRIG 57.0 05/30/2011 0850   HDL 49.90 05/30/2011 0850   CHOLHDL 3 05/30/2011 0850   VLDL 11.4 05/30/2011 0850   LDLCALC 99 05/30/2011 0850    CBC    Component Value Date/Time   WBC 4.7 05/30/2011 0850   RBC 4.82 05/30/2011 0850   HGB 15.1 05/30/2011 0850   HCT 44.4 05/30/2011 0850   PLT 197.0 05/30/2011 0850   MCV 92.2 05/30/2011 0850   MCHC 34.0 05/30/2011 0850   RDW 13.0 05/30/2011 0850   LYMPHSABS 1.3 05/30/2011 0850   MONOABS 0.3 05/30/2011 0850   EOSABS 0.4 05/30/2011 0850   BASOSABS 0.0 05/30/2011 0850    Hgb A1C No results found for this basename: HGBA1C          Assessment & Plan:   Paronychia of left thumb, new onset:  Explained risk and beneifits of procedure including bleeding, worsening infection.  Verbal Consent obtained Left thumb cleaned in the usual fashion with betadine and alcohol Numbed with numbing spray The area of fluctuance was poked with a 21 g needle Immediate purulent drainage noted. Pus expressed until no further drainage noted. Area wiped with alcohol, triple antibiotic ointment placed on it and covered with band aid Aftercare instructions given.  Abx switched to septra BID x 5 days  Diclofenac switched to tramadol as needed  RTC as needed or if you notice redness expanding, worsening pain or increased drainage

## 2012-08-28 ENCOUNTER — Other Ambulatory Visit: Payer: Self-pay | Admitting: Family Medicine

## 2012-10-10 ENCOUNTER — Ambulatory Visit (INDEPENDENT_AMBULATORY_CARE_PROVIDER_SITE_OTHER): Payer: BC Managed Care – PPO | Admitting: Internal Medicine

## 2012-10-10 ENCOUNTER — Encounter: Payer: Self-pay | Admitting: Internal Medicine

## 2012-10-10 VITALS — BP 120/80 | HR 60 | Temp 97.8°F | Resp 20 | Wt 158.0 lb

## 2012-10-10 DIAGNOSIS — L237 Allergic contact dermatitis due to plants, except food: Secondary | ICD-10-CM

## 2012-10-10 DIAGNOSIS — L255 Unspecified contact dermatitis due to plants, except food: Secondary | ICD-10-CM

## 2012-10-10 MED ORDER — PREDNISONE 10 MG PO TABS: 10.0000 mg | ORAL_TABLET | Freq: Every day | ORAL | Status: DC

## 2012-10-10 MED ORDER — METHYLPREDNISOLONE ACETATE 80 MG/ML IJ SUSP
80.0000 mg | Freq: Once | INTRAMUSCULAR | Status: AC
  Administered 2012-10-10: 80 mg via INTRAMUSCULAR

## 2012-10-10 NOTE — Patient Instructions (Signed)
Poison Ivy Poison ivy is a inflammation of the skin (contact dermatitis) caused by touching the allergens on the leaves of the ivy plant following previous exposure to the plant. The rash usually appears 48 hours after exposure. The rash is usually bumps (papules) or blisters (vesicles) in a linear pattern. Depending on your own sensitivity, the rash may simply cause redness and itching, or it may also progress to blisters which may break open. These must be well cared for to prevent secondary bacterial (germ) infection, followed by scarring. Keep any open areas dry, clean, dressed, and covered with an antibacterial ointment if needed. The eyes may also get puffy. The puffiness is worst in the morning and gets better as the day progresses. This dermatitis usually heals without scarring, within 2 to 3 weeks without treatment. HOME CARE INSTRUCTIONS  Thoroughly wash with soap and water as soon as you have been exposed to poison ivy. You have about one half hour to remove the plant resin before it will cause the rash. This washing will destroy the oil or antigen on the skin that is causing, or will cause, the rash. Be sure to wash under your fingernails as any plant resin there will continue to spread the rash. Do not rub skin vigorously when washing affected area. Poison ivy cannot spread if no oil from the plant remains on your body. A rash that has progressed to weeping sores will not spread the rash unless you have not washed thoroughly. It is also important to wash any clothes you have been wearing as these may carry active allergens. The rash will return if you wear the unwashed clothing, even several days later. Avoidance of the plant in the future is the best measure. Poison ivy plant can be recognized by the number of leaves. Generally, poison ivy has three leaves with flowering branches on a single stem. Diphenhydramine may be purchased over the counter and used as needed for itching. Do not drive with  this medication if it makes you drowsy.Ask your caregiver about medication for children. SEEK MEDICAL CARE IF:  Open sores develop.  Redness spreads beyond area of rash.  You notice purulent (pus-like) discharge.  You have increased pain.  Other signs of infection develop (such as fever). Document Released: 01/01/2000 Document Revised: 03/28/2011 Document Reviewed: 11/19/2008 Exit[employer] Patient Information 2014 [employer].  

## 2012-10-10 NOTE — Progress Notes (Signed)
Subjective:    Patient ID: Zachary Gutierrez, male    DOB: [birth year], 52 y.o.   MRN: [id number]  HPI  52 year old patient who is in today for evaluation and treatment of a contact dermatitis. 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 He has had an extensive rash involving his arms and trunk region. He feels the rash continues to spread.  Past Medical History  Diagnosis Date  . GOUT 09/11/2006    Qualifier: Diagnosis of  By: Everett Graff    . ATTENTION DEFICIT DISORDER, ADULT 12/09/2008    Qualifier: Diagnosis of  By: Tawanna Cooler MD, Eugenio Hoes   . HYPERTENSION 09/11/2006    Qualifier: Diagnosis of  By: Everett Graff    . VIRAL URI 01/05/2008    Qualifier: Diagnosis of  By: Tawanna Cooler MD, Eugenio Hoes   . ALLERGIC RHINITIS 09/11/2006    Qualifier: Diagnosis of  By: Everett Graff    . Extrinsic asthma, unspecified 01/10/2008    Qualifier: Diagnosis of  By: Tawanna Cooler MD, Eugenio Hoes DYSPEPSIA 03/21/2008    Qualifier: Diagnosis of  By: Amador Cunas  MD, Janett Labella   . Carbuncle and furuncle of buttock 11/20/2006    Qualifier: Diagnosis of  By: Tawanna Cooler MD, Eugenio Hoes   . PILONIDAL CYST 01/31/2008    Qualifier: Diagnosis of  By: Tawanna Cooler MD, Eugenio Hoes   . SEBACEOUS CYST, NECK 09/28/2006    Qualifier: Diagnosis of  By: Tawanna Cooler MD, Eugenio Hoes   . PALPITATIONS, HX OF 03/21/2008    Qualifier: Diagnosis of  By: Amador Cunas  MD, Janett Labella NEPHROLITHIASIS, HX OF 01/31/2008    Qualifier: Diagnosis of  By: Tawanna Cooler MD, Eugenio Hoes     History   Social History  . Marital Status: Married    Spouse Name: N/A    Number of Children: N/A  . Years of Education: N/A   Occupational History  . Not on file.   Social History Main Topics  . Smoking status: Never Smoker   . Smokeless tobacco: Never Used  . Alcohol Use: No  . Drug Use: No  . Sexual Activity: Not on file   Other Topics Concern  . Not on file   Social History Narrative  . No narrative on file    Past Surgical History  Procedure  Laterality Date  . Nephrolithiasis      Family History  Problem Relation Age of Onset  . Hypertension Other     No Known Allergies  Current Outpatient Prescriptions on File Prior to Visit  Medication Sig Dispense Refill  . aspirin 81 MG tablet Take 81 mg by mouth daily.      . fexofenadine (ALLEGRA) 180 MG tablet Take 180 mg by mouth daily.      Marland Kitchen losartan-hydrochlorothiazide (HYZAAR) 50-12.5 MG per tablet take 1 tablet by mouth once daily  100 tablet  0   No current facility-administered medications on file prior to visit.    BP 120/80  Pulse 60  Temp(Src) 97.8 F (36.6 C) (Oral)  Resp 20  Wt 158 lb (71.668 kg)  BMI 27.11 kg/m2  SpO2 98%       Review of Systems  Skin: Positive for rash.       Objective:   Physical Exam  Constitutional: He appears well-developed and well-nourished. No distress.  Skin: Rash noted.  Erythematous rash noted over the trunk and arms.  Some excoriations and weeping noted involving  the lower left arm          Assessment & Plan:   Contact dermatitis. Will treat with Depo-Medrol. We'll continue oral antihistamines. Topical therapy discussed. He was also given a prescription for oral prednisone if needed

## 2012-11-30 ENCOUNTER — Other Ambulatory Visit: Payer: Self-pay | Admitting: Family Medicine

## 2013-03-05 ENCOUNTER — Other Ambulatory Visit: Payer: Self-pay | Admitting: Family Medicine

## 2013-06-08 ENCOUNTER — Other Ambulatory Visit: Payer: Self-pay | Admitting: Family Medicine

## 2013-09-14 ENCOUNTER — Other Ambulatory Visit: Payer: Self-pay | Admitting: Family Medicine

## 2013-12-09 ENCOUNTER — Telehealth: Payer: Self-pay | Admitting: Family Medicine

## 2013-12-09 MED ORDER — LOSARTAN POTASSIUM-HCTZ 50-12.5 MG PO TABS: ORAL_TABLET | ORAL | Status: DC

## 2013-12-09 NOTE — Telephone Encounter (Signed)
Pt request refill of the following: losartan-hydrochlorothiazide (HYZAAR) 50-12.5 MG per tablet   [facility] [address]

## 2014-01-20 ENCOUNTER — Other Ambulatory Visit: Payer: Self-pay | Admitting: Family Medicine

## 2014-01-22 ENCOUNTER — Telehealth: Payer: Self-pay | Admitting: Family Medicine

## 2014-01-22 MED ORDER — LOSARTAN POTASSIUM-HCTZ 50-12.5 MG PO TABS: ORAL_TABLET | ORAL | Status: DC

## 2014-01-22 NOTE — Telephone Encounter (Signed)
Rx sent 

## 2014-01-22 NOTE — Telephone Encounter (Signed)
Pt request refill losartan-hydrochlorothiazide (HYZAAR) 50-12.5 MG per tablet Pt made a cpe and is out of this med. Can you send 30 days in? [facility] [address]

## 2014-02-03 ENCOUNTER — Other Ambulatory Visit (INDEPENDENT_AMBULATORY_CARE_PROVIDER_SITE_OTHER): Payer: BLUE CROSS/BLUE SHIELD

## 2014-02-03 ENCOUNTER — Other Ambulatory Visit (INDEPENDENT_AMBULATORY_CARE_PROVIDER_SITE_OTHER): Payer: Self-pay | Admitting: *Deleted

## 2014-02-03 DIAGNOSIS — Z0000 Encounter for general adult medical examination without abnormal findings: Secondary | ICD-10-CM

## 2014-02-03 LAB — CBC WITH DIFFERENTIAL/PLATELET
BASOS ABS: 0 10*3/uL (ref 0.0–0.1)
Basophils Relative: 0.7 % (ref 0.0–3.0)
EOS ABS: 0.3 10*3/uL (ref 0.0–0.7)
EOS PCT: 6.2 % — AB (ref 0.0–5.0)
HCT: 46 % (ref 39.0–52.0)
Hemoglobin: 15.4 g/dL (ref 13.0–17.0)
LYMPHS PCT: 35.7 % (ref 12.0–46.0)
Lymphs Abs: 1.7 10*3/uL (ref 0.7–4.0)
MCHC: 33.5 g/dL (ref 30.0–36.0)
MCV: 91.5 fl (ref 78.0–100.0)
Monocytes Absolute: 0.4 10*3/uL (ref 0.1–1.0)
Monocytes Relative: 9.2 % (ref 3.0–12.0)
NEUTROS PCT: 48.2 % (ref 43.0–77.0)
Neutro Abs: 2.2 10*3/uL (ref 1.4–7.7)
Platelets: 213 10*3/uL (ref 150.0–400.0)
RBC: 5.03 Mil/uL (ref 4.22–5.81)
RDW: 12.8 % (ref 11.5–15.5)
WBC: 4.7 10*3/uL (ref 4.0–10.5)

## 2014-02-03 LAB — LIPID PANEL
CHOL/HDL RATIO: 4
Cholesterol: 148 mg/dL (ref 0–200)
HDL: 40.7 mg/dL (ref >39.00)
LDL Cholesterol: 83 mg/dL (ref 0–99)
NonHDL: 107.3
Triglycerides: 121 mg/dL (ref 0.0–149.0)
VLDL: 24.2 mg/dL (ref 0.0–40.0)

## 2014-02-03 LAB — PSA: PSA: 1.06 ng/mL (ref 0.10–4.00)

## 2014-02-03 LAB — POCT URINALYSIS DIPSTICK
BILIRUBIN UA: NEGATIVE
GLUCOSE UA: NEGATIVE
KETONES UA: NEGATIVE
LEUKOCYTES UA: NEGATIVE
Nitrite, UA: NEGATIVE
PROTEIN UA: NEGATIVE
SPEC GRAV UA: 1.02
UROBILINOGEN UA: 0.2
pH, UA: 5

## 2014-02-03 LAB — HEPATIC FUNCTION PANEL
ALBUMIN: 3.7 g/dL (ref 3.5–5.2)
ALK PHOS: 66 U/L (ref 39–117)
ALT: 19 U/L (ref 0–53)
AST: 19 U/L (ref 0–37)
Bilirubin, Direct: 0.2 mg/dL (ref 0.0–0.3)
TOTAL PROTEIN: 6.4 g/dL (ref 6.0–8.3)
Total Bilirubin: 1.2 mg/dL (ref 0.2–1.2)

## 2014-02-03 LAB — COMPREHENSIVE METABOLIC PANEL
ALT: 19 U/L (ref 0–53)
AST: 20 U/L (ref 0–37)
Albumin: 3.7 g/dL (ref 3.5–5.2)
Alkaline Phosphatase: 65 U/L (ref 39–117)
BILIRUBIN TOTAL: 1.2 mg/dL (ref 0.2–1.2)
BUN: 28 mg/dL — ABNORMAL HIGH (ref 6–23)
CO2: 23 meq/L (ref 19–32)
CREATININE: 1.38 mg/dL (ref 0.40–1.50)
Calcium: 8.7 mg/dL (ref 8.4–10.5)
Chloride: 107 mEq/L (ref 96–112)
GFR: 57.17 mL/min — ABNORMAL LOW (ref >60.00)
Glucose, Bld: 122 mg/dL — ABNORMAL HIGH (ref 70–99)
POTASSIUM: 4.3 meq/L (ref 3.5–5.1)
Sodium: 137 mEq/L (ref 135–145)
TOTAL PROTEIN: 6.3 g/dL (ref 6.0–8.3)

## 2014-02-03 LAB — TSH: TSH: 1.69 u[IU]/mL (ref 0.35–4.50)

## 2014-02-10 ENCOUNTER — Encounter: Payer: Self-pay | Admitting: Family Medicine

## 2014-02-10 ENCOUNTER — Ambulatory Visit (INDEPENDENT_AMBULATORY_CARE_PROVIDER_SITE_OTHER): Payer: BLUE CROSS/BLUE SHIELD | Admitting: Family Medicine

## 2014-02-10 VITALS — BP 120/84 | Temp 97.9°F | Ht 65.0 in | Wt 161.0 lb

## 2014-02-10 DIAGNOSIS — Z0000 Encounter for general adult medical examination without abnormal findings: Secondary | ICD-10-CM

## 2014-02-10 DIAGNOSIS — Z23 Encounter for immunization: Secondary | ICD-10-CM

## 2014-02-10 DIAGNOSIS — I10 Essential (primary) hypertension: Secondary | ICD-10-CM

## 2014-02-10 DIAGNOSIS — R739 Hyperglycemia, unspecified: Secondary | ICD-10-CM

## 2014-02-10 DIAGNOSIS — R7309 Other abnormal glucose: Secondary | ICD-10-CM

## 2014-02-10 MED ORDER — LOSARTAN POTASSIUM-HCTZ 50-12.5 MG PO TABS: ORAL_TABLET | ORAL | Status: DC

## 2014-02-10 MED ORDER — ALLOPURINOL 300 MG PO TABS: 300.0000 mg | ORAL_TABLET | Freq: Every day | ORAL | Status: DC

## 2014-02-10 NOTE — Progress Notes (Signed)
Pre visit review using our clinic review tool, if applicable. No additional management support is needed unless otherwise documented below in the visit note. 

## 2014-02-10 NOTE — Progress Notes (Signed)
Subjective:    Patient ID: Zachary Gutierrez, male    DOB: 26-Jun-1960, 54 y.o.   MRN: [id number]  HPI Zachary Gutierrez is a 54 year old married male nonsmoker who comes in today for general physical examination because of a history of hypertension and allergic rhinitis  He takes Hyzaar 50-12.5 daily for hypertension BP 120/84  He gets routine eye care and dental care. He's never had a colonoscopy. Will refer to GI for screening colonoscopy. No family history of colon cancer polyps or change in bowel habits or bleeding  Vaccinations updated by [facility] he was given a tetanus booster and flu shot today.  He also takes Allegra and plain for allergic rhinitis and an aspirin tablet  He continues to stay physically active he coaches JV soccer at [employer]. He also lifts weights and does a lot of aerobic exercise.  He married a 53 year old son Zachary Gutierrez and a 70 year old daughter Zachary Gutierrez both of whom are in good health  He's had a history of kidney stones in the past and he passed one last week when he got his urine here for his labs. He was on allopurinol [facility] many years ago because of a high uric acid level. He stopped the allopurinol. I recommend he go back on the allopurinol. Also his GFR is dropped from 70 down to 50  He also has mildly elevated blood sugar fasting 122   Review of Systems  Constitutional: Negative.   HENT: Negative.   Eyes: Negative.   Respiratory: Negative.   Cardiovascular: Negative.   Gastrointestinal: Negative.   Endocrine: Negative.   Genitourinary: Negative.   Musculoskeletal: Negative.   Skin: Negative.   Allergic/Immunologic: Negative.   Neurological: Negative.   Hematological: Negative.   Psychiatric/Behavioral: Negative.        Objective:   Physical Exam  Constitutional: He is oriented to person, place, and time. He appears well-developed and well-nourished.  HENT:  Head: Normocephalic and atraumatic.  Right Ear: External ear normal.  Left Ear:  External ear normal.  Nose: Nose normal.  Mouth/Throat: Oropharynx is clear and moist.  Eyes: Conjunctivae and EOM are normal. Pupils are equal, round, and reactive to light.  Neck: Normal range of motion. Neck supple. No JVD present. No tracheal deviation present. No thyromegaly present.  Cardiovascular: Normal rate, regular rhythm, normal heart sounds and intact distal pulses.  Exam reveals no gallop and no friction rub.   No murmur heard. No carotid nor aortic bruits peripheral pulses 2+ and symmetrical  Pulmonary/Chest: Effort normal and breath sounds normal. No stridor. No respiratory distress. He has no wheezes. He has no rales. He exhibits no tenderness.  Abdominal: Soft. Bowel sounds are normal. He exhibits no distension and no mass. There is no tenderness. There is no rebound and no guarding.  Genitourinary: Rectum normal, prostate normal and penis normal. Guaiac negative stool. No penile tenderness.  Musculoskeletal: Normal range of motion. He exhibits no edema or tenderness.  Lymphadenopathy:    He has no cervical adenopathy.  Neurological: He is alert and oriented to person, place, and time. He has normal reflexes. No cranial nerve deficit. He exhibits normal muscle tone.  Skin: Skin is warm and dry. No rash noted. No erythema. No pallor.    Total body skin exam normal except for a lesion on his left buttocks. Appears to be a old sebaceous cyst that is formed some scar tissue. Advised to return for removal  Psychiatric: He has a normal mood and affect. His behavior  is normal. Judgment and thought content normal.  Nursing note and vitals reviewed.         Assessment & Plan:  Healthy male  Hypertension ago continue current therapy  Abnormal lesion left buttocks return for removal  History of kidney stones....... he passed one last week when he got his routine urine therefore the 2+ blood in his urine.  Elevated blood sugar fasting 122.......... carbohydrate free  diet..... Follow-up blood sugar A1c in 5 months

## 2014-02-10 NOTE — Patient Instructions (Addendum)
Continue current medications  Follow-up in one year sooner if any problems  Return sometime in the next week or 2,,,,,, 30 minute appointment for removal of the lesion we discussed  Sugar free diet  Drink lots of water  Follow-up in May nonfasting labs  Restart the allopurinol 300 mg.......... one tablet daily forever

## 2014-02-11 ENCOUNTER — Telehealth: Payer: Self-pay | Admitting: Family Medicine

## 2014-02-11 NOTE — Telephone Encounter (Signed)
emmi mailed  °

## 2014-03-11 ENCOUNTER — Ambulatory Visit (INDEPENDENT_AMBULATORY_CARE_PROVIDER_SITE_OTHER): Payer: BLUE CROSS/BLUE SHIELD | Admitting: Family Medicine

## 2014-03-11 DIAGNOSIS — L03319 Cellulitis of trunk, unspecified: Secondary | ICD-10-CM

## 2014-03-11 DIAGNOSIS — L02219 Cutaneous abscess of trunk, unspecified: Secondary | ICD-10-CM

## 2014-03-11 NOTE — Progress Notes (Signed)
Subjective:    Patient ID: Zachary Gutierrez, male    DOB: [birth year], 54 y.o.   MRN: [id number]  HPI [name] is a 54 year old married male nonsmoker who comes in for removal of the lesion on his posterior left hip  He says for the past 54 years he's had recurrent episodes where it will drain positive heel but not completely heal. Does not recall any history of trauma   Review of Systems Review of systems negative    Objective:   Physical Exam  Well-developed well-nourished male no acute scrotal vital signs stable he is afebrile examination area shows a silver dollar size area of erythema and scarring. With expression there is possible. I suspect he has a small abscesses never completely resolved  After informed consent lesion was cleaned with alcohol and Betadine. It was anesthetized with 1% Xylocaine with epinephrine. The scar tissue was excised. The base was clean the packing was applied dry sterile dressing was applied to that.      Assessment & Plan:  Abscess recurrent........ treated as above return in 48 hours for

## 2014-03-11 NOTE — Patient Instructions (Signed)
Return in 48 hours for dressing change. Keep the area clean and dry

## 2014-03-13 ENCOUNTER — Ambulatory Visit (INDEPENDENT_AMBULATORY_CARE_PROVIDER_SITE_OTHER): Payer: BLUE CROSS/BLUE SHIELD | Admitting: Family Medicine

## 2014-03-13 ENCOUNTER — Encounter: Payer: Self-pay | Admitting: Family Medicine

## 2014-03-13 DIAGNOSIS — L02219 Cutaneous abscess of trunk, unspecified: Secondary | ICD-10-CM

## 2014-03-13 DIAGNOSIS — L03319 Cellulitis of trunk, unspecified: Secondary | ICD-10-CM

## 2014-03-13 NOTE — Progress Notes (Signed)
Subjective:    Patient ID: Zachary Gutierrez, male    DOB: 01-Mar-1960, 54 y.o.   MRN: [id number]  HPI Zachary Gutierrez is a 54 year old married male nonsmoker who comes in today for follow-up  48 hours ago we excise an abscess on his left hip. We packed it dry sterile dressing he's here today for follow-up.  No complaints   Review of Systems Review of systems negative    Objective:   Physical Exam  Well-developed well-nourished male no acute distress vital signs stable he is afebrile examination hip shows the packing intact. Area was cleaned the packing was removed. He was instructed had a cleanout 3 times a day with a Q-tip      Assessment & Plan:  Abscess resolved.................. clean 3 times daily with a Q-tip return when necessary.Marland Kitchen

## 2014-03-13 NOTE — Patient Instructions (Signed)
Clean the area 3 times daily with a Q-tip and antibiotic ointment  Return when necessary

## 2014-03-14 ENCOUNTER — Encounter: Payer: Self-pay | Admitting: Internal Medicine

## 2014-06-03 ENCOUNTER — Other Ambulatory Visit (INDEPENDENT_AMBULATORY_CARE_PROVIDER_SITE_OTHER): Payer: BLUE CROSS/BLUE SHIELD

## 2014-06-03 DIAGNOSIS — R7309 Other abnormal glucose: Secondary | ICD-10-CM | POA: Diagnosis not present

## 2014-06-03 DIAGNOSIS — R739 Hyperglycemia, unspecified: Secondary | ICD-10-CM

## 2014-06-03 LAB — BASIC METABOLIC PANEL
BUN: 23 mg/dL (ref 6–23)
CHLORIDE: 107 meq/L (ref 96–112)
CO2: 25 meq/L (ref 19–32)
Calcium: 9.9 mg/dL (ref 8.4–10.5)
Creatinine, Ser: 1.35 mg/dL (ref 0.40–1.50)
GFR: 58.56 mL/min — ABNORMAL LOW (ref >60.00)
Glucose, Bld: 99 mg/dL (ref 70–99)
Potassium: 4.8 mEq/L (ref 3.5–5.1)
Sodium: 138 mEq/L (ref 135–145)

## 2014-06-03 LAB — POCT URINALYSIS DIPSTICK
Bilirubin, UA: NEGATIVE
Glucose, UA: NEGATIVE
Ketones, UA: NEGATIVE
LEUKOCYTES UA: NEGATIVE
Nitrite, UA: NEGATIVE
Protein, UA: NEGATIVE
Spec Grav, UA: 1.02
Urobilinogen, UA: 0.2
pH, UA: 5.5

## 2014-06-03 LAB — HEMOGLOBIN A1C: Hgb A1c MFr Bld: 5.6 % (ref 4.6–6.5)

## 2015-04-02 ENCOUNTER — Telehealth: Payer: Self-pay | Admitting: Family Medicine

## 2015-04-02 MED ORDER — LOSARTAN POTASSIUM-HCTZ 50-12.5 MG PO TABS: ORAL_TABLET | ORAL | Status: DC

## 2015-04-02 NOTE — Telephone Encounter (Signed)
Pt request refill of the following: losartan-hydrochlorothiazide (HYZAAR) 50-12.5 MG per tablet   Phamacy: [facility] [address]

## 2015-06-09 ENCOUNTER — Other Ambulatory Visit: Payer: Self-pay | Admitting: Orthopedic Surgery

## 2015-06-09 ENCOUNTER — Ambulatory Visit
Admission: RE | Admit: 2015-06-09 | Discharge: 2015-06-09 | Disposition: A | Discharge status: Home / Self Care | Payer: 59 | Source: Ambulatory Visit | Attending: Orthopedic Surgery | Admitting: Orthopedic Surgery

## 2015-06-09 DIAGNOSIS — R609 Edema, unspecified: Secondary | ICD-10-CM

## 2015-06-09 DIAGNOSIS — R531 Weakness: Secondary | ICD-10-CM

## 2015-06-09 DIAGNOSIS — R52 Pain, unspecified: Secondary | ICD-10-CM

## 2015-06-25 ENCOUNTER — Encounter: Payer: Self-pay | Admitting: Family Medicine

## 2015-10-28 ENCOUNTER — Other Ambulatory Visit: Payer: BLUE CROSS/BLUE SHIELD

## 2015-11-03 ENCOUNTER — Encounter: Payer: BLUE CROSS/BLUE SHIELD | Admitting: Family Medicine

## 2015-11-07 ENCOUNTER — Other Ambulatory Visit: Payer: Self-pay | Admitting: Family Medicine

## 2015-12-08 ENCOUNTER — Other Ambulatory Visit: Payer: 59

## 2015-12-15 ENCOUNTER — Encounter: Payer: 59 | Admitting: Family Medicine

## 2016-02-18 ENCOUNTER — Other Ambulatory Visit (INDEPENDENT_AMBULATORY_CARE_PROVIDER_SITE_OTHER): Payer: 59

## 2016-02-18 DIAGNOSIS — Z0000 Encounter for general adult medical examination without abnormal findings: Secondary | ICD-10-CM | POA: Diagnosis not present

## 2016-02-18 LAB — BASIC METABOLIC PANEL
BUN: 18 mg/dL (ref 6–23)
CALCIUM: 10.1 mg/dL (ref 8.4–10.5)
CO2: 27 mEq/L (ref 19–32)
CREATININE: 1.15 mg/dL (ref 0.40–1.50)
Chloride: 105 mEq/L (ref 96–112)
GFR: 70.02 mL/min (ref >60.00)
Glucose, Bld: 106 mg/dL — ABNORMAL HIGH (ref 70–99)
Potassium: 4.1 mEq/L (ref 3.5–5.1)
SODIUM: 138 meq/L (ref 135–145)

## 2016-02-18 LAB — CBC WITH DIFFERENTIAL/PLATELET
BASOS PCT: 0.8 % (ref 0.0–3.0)
Basophils Absolute: 0 10*3/uL (ref 0.0–0.1)
EOS PCT: 5.8 % — AB (ref 0.0–5.0)
Eosinophils Absolute: 0.2 10*3/uL (ref 0.0–0.7)
HEMATOCRIT: 46.7 % (ref 39.0–52.0)
HEMOGLOBIN: 16.3 g/dL (ref 13.0–17.0)
LYMPHS PCT: 29.2 % (ref 12.0–46.0)
Lymphs Abs: 1.2 10*3/uL (ref 0.7–4.0)
MCHC: 34.9 g/dL (ref 30.0–36.0)
MCV: 88.8 fl (ref 78.0–100.0)
MONO ABS: 0.3 10*3/uL (ref 0.1–1.0)
MONOS PCT: 6.9 % (ref 3.0–12.0)
Neutro Abs: 2.4 10*3/uL (ref 1.4–7.7)
Neutrophils Relative %: 57.3 % (ref 43.0–77.0)
Platelets: 235 10*3/uL (ref 150.0–400.0)
RBC: 5.27 Mil/uL (ref 4.22–5.81)
RDW: 13 % (ref 11.5–15.5)
WBC: 4.2 10*3/uL (ref 4.0–10.5)

## 2016-02-18 LAB — POC URINALSYSI DIPSTICK (AUTOMATED)
Bilirubin, UA: NEGATIVE
GLUCOSE UA: NEGATIVE
Ketones, UA: NEGATIVE
LEUKOCYTES UA: NEGATIVE
NITRITE UA: NEGATIVE
PROTEIN UA: NEGATIVE
SPEC GRAV UA: 1.02
UROBILINOGEN UA: 0.2
pH, UA: 5.5

## 2016-02-18 LAB — HEPATIC FUNCTION PANEL
ALK PHOS: 67 U/L (ref 39–117)
ALT: 30 U/L (ref 0–53)
AST: 25 U/L (ref 0–37)
Albumin: 4.4 g/dL (ref 3.5–5.2)
BILIRUBIN DIRECT: 0.2 mg/dL (ref 0.0–0.3)
Total Bilirubin: 1.5 mg/dL — ABNORMAL HIGH (ref 0.2–1.2)
Total Protein: 6.9 g/dL (ref 6.0–8.3)

## 2016-02-18 LAB — LIPID PANEL
CHOL/HDL RATIO: 3
Cholesterol: 186 mg/dL (ref 0–200)
HDL: 55.6 mg/dL (ref >39.00)
LDL Cholesterol: 118 mg/dL — ABNORMAL HIGH (ref 0–99)
NONHDL: 130.73
TRIGLYCERIDES: 63 mg/dL (ref 0.0–149.0)
VLDL: 12.6 mg/dL (ref 0.0–40.0)

## 2016-02-18 LAB — PSA: PSA: 1.1 ng/mL (ref 0.10–4.00)

## 2016-02-18 LAB — TSH: TSH: 2.09 u[IU]/mL (ref 0.35–4.50)

## 2016-02-24 ENCOUNTER — Encounter: Payer: Self-pay | Admitting: Family Medicine

## 2016-02-24 ENCOUNTER — Ambulatory Visit (INDEPENDENT_AMBULATORY_CARE_PROVIDER_SITE_OTHER): Payer: 59 | Admitting: Family Medicine

## 2016-02-24 VITALS — BP 110/80 | HR 74 | Temp 98.2°F | Ht 64.0 in | Wt 154.9 lb

## 2016-02-24 DIAGNOSIS — I10 Essential (primary) hypertension: Secondary | ICD-10-CM

## 2016-02-24 DIAGNOSIS — R739 Hyperglycemia, unspecified: Secondary | ICD-10-CM

## 2016-02-24 DIAGNOSIS — Z0000 Encounter for general adult medical examination without abnormal findings: Secondary | ICD-10-CM | POA: Diagnosis not present

## 2016-02-24 MED ORDER — ALLOPURINOL 300 MG PO TABS: 300.0000 mg | ORAL_TABLET | Freq: Every day | ORAL | 4 refills | Status: DC

## 2016-02-24 MED ORDER — LOSARTAN POTASSIUM-HCTZ 50-12.5 MG PO TABS: 1.0000 | ORAL_TABLET | Freq: Every day | ORAL | 4 refills | Status: DC

## 2016-02-24 NOTE — Progress Notes (Signed)
Pre visit review using our clinic review tool, if applicable. No additional management support is needed unless otherwise documented below in the visit note. 

## 2016-02-24 NOTE — Patient Instructions (Signed)
Continue current medication.......... Hyzaar 1 daily in the morning for your blood pressure and allopurinol 300 mg daily to prevent gout and kidney stones  Continue diet and exercise program..... Avoid sugar...Marland Kitchen.Marland Kitchen.Marland Kitchen. fasting labs the fourth week in April............... I will call you the report

## 2016-02-24 NOTE — Progress Notes (Signed)
Onalee HuaDavid is a 56 year old married male nonsmoker who comes in today for annual physical examination because of a history of hypertension.  He feels his overall health is good. He has dry eyes and had plugs put in his eyes recently. He also had a torn right patellar tendon. Repaired Annie's in rehabilitation doing well restarting the spring again. He plays adult soccer.  He gets routine eye care, dental care, never had a colonoscopy.  He has a history of gout but is not taking yellow kernel. He's also passed small stones intermittently. Advised to go back on the allopurinol.  He takes Hyzaar 50-12.5 daily for hypertension BP 110/80  Social history [place] he works in Multimedia programmercommercial real estate. Wife caffeine good health son Agilent TechnologiesLuke Junior page and a daughter Delorise ShinerGrace a sophomore page. Both children doing well.  14 point review of systems reviewed and otherwise negative  BP 110/80 (BP Location: Left Arm, Patient Position: Sitting, Cuff Size: Normal)   Pulse 74   Temp 98.2 F (36.8 C) (Oral)   Ht 5\' 4"  (1.626 m)   Wt 154 lb 14.4 oz (70.3 kg)   BMI 26.59 kg/m  HEENT were negative neck was supple no adenopathy thyroid normal no carotid bruits cardiopulmonary exam normal abdominal exam normal genitalia normal circumcised male rectum normal stool guaiac-negative prostate normal extremity is normal skin normal peripheral pulses normal  #1 hypertension at goal.....Marland Kitchen. continue current therapy  #2 status post right knee surgery  #3 glucose intolerance fasting blood sugar 106.......... continue exercise program..... Avoid sugar.......... fasting blood sugar and A1c in May.

## 2016-03-24 ENCOUNTER — Encounter: Payer: Self-pay | Admitting: Family Medicine

## 2016-11-10 ENCOUNTER — Emergency Department (HOSPITAL_COMMUNITY): Payer: BLUE CROSS/BLUE SHIELD

## 2016-11-10 ENCOUNTER — Encounter (HOSPITAL_COMMUNITY): Payer: Self-pay | Admitting: Emergency Medicine

## 2016-11-10 DIAGNOSIS — Z79899 Other long term (current) drug therapy: Secondary | ICD-10-CM | POA: Diagnosis not present

## 2016-11-10 DIAGNOSIS — Z7982 Long term (current) use of aspirin: Secondary | ICD-10-CM | POA: Diagnosis not present

## 2016-11-10 DIAGNOSIS — J45909 Unspecified asthma, uncomplicated: Secondary | ICD-10-CM | POA: Insufficient documentation

## 2016-11-10 DIAGNOSIS — R072 Precordial pain: Secondary | ICD-10-CM | POA: Diagnosis not present

## 2016-11-10 DIAGNOSIS — R141 Gas pain: Secondary | ICD-10-CM | POA: Insufficient documentation

## 2016-11-10 DIAGNOSIS — I10 Essential (primary) hypertension: Secondary | ICD-10-CM | POA: Diagnosis not present

## 2016-11-10 DIAGNOSIS — R079 Chest pain, unspecified: Secondary | ICD-10-CM | POA: Diagnosis present

## 2016-11-10 LAB — CBC
HCT: 47.5 % (ref 39.0–52.0)
HEMOGLOBIN: 17.2 g/dL — AB (ref 13.0–17.0)
MCH: 31.8 pg (ref 26.0–34.0)
MCHC: 36.2 g/dL — ABNORMAL HIGH (ref 30.0–36.0)
MCV: 87.8 fL (ref 78.0–100.0)
PLATELETS: 286 10*3/uL (ref 150–400)
RBC: 5.41 MIL/uL (ref 4.22–5.81)
RDW: 13.1 % (ref 11.5–15.5)
WBC: 10.7 10*3/uL — AB (ref 4.0–10.5)

## 2016-11-10 LAB — BASIC METABOLIC PANEL
ANION GAP: 13 (ref 5–15)
BUN: 31 mg/dL — ABNORMAL HIGH (ref 6–20)
CALCIUM: 9.2 mg/dL (ref 8.9–10.3)
CO2: 20 mmol/L — ABNORMAL LOW (ref 22–32)
Chloride: 103 mmol/L (ref 101–111)
Creatinine, Ser: 1.43 mg/dL — ABNORMAL HIGH (ref 0.61–1.24)
GFR, EST NON AFRICAN AMERICAN: 53 mL/min — AB (ref >60)
Glucose, Bld: 140 mg/dL — ABNORMAL HIGH (ref 65–99)
Potassium: 3.2 mmol/L — ABNORMAL LOW (ref 3.5–5.1)
SODIUM: 136 mmol/L (ref 135–145)

## 2016-11-10 LAB — I-STAT TROPONIN, ED: TROPONIN I, POC: 0 ng/mL (ref 0.00–0.08)

## 2016-11-10 NOTE — ED Triage Notes (Signed)
Pt c/o chest tightness and palpitations x 1 day. Pt at the gym today and discomfort increased, also reports increased stress. Denies shortness of breath/nausea/vomiting.

## 2016-11-11 ENCOUNTER — Other Ambulatory Visit: Payer: Self-pay

## 2016-11-11 ENCOUNTER — Emergency Department (HOSPITAL_COMMUNITY)
Admission: EM | Admit: 2016-11-11 | Discharge: 2016-11-11 | Disposition: A | Discharge status: Home / Self Care | Payer: BLUE CROSS/BLUE SHIELD | Attending: Emergency Medicine | Admitting: Emergency Medicine

## 2016-11-11 DIAGNOSIS — R072 Precordial pain: Secondary | ICD-10-CM

## 2016-11-11 DIAGNOSIS — R141 Gas pain: Secondary | ICD-10-CM

## 2016-11-11 LAB — HEPATIC FUNCTION PANEL
ALT: 21 U/L (ref 17–63)
AST: 21 U/L (ref 15–41)
Albumin: 3.9 g/dL (ref 3.5–5.0)
Alkaline Phosphatase: 69 U/L (ref 38–126)
BILIRUBIN INDIRECT: 1.3 mg/dL — AB (ref 0.3–0.9)
Bilirubin, Direct: 0.2 mg/dL (ref 0.1–0.5)
TOTAL PROTEIN: 6.8 g/dL (ref 6.5–8.1)
Total Bilirubin: 1.5 mg/dL — ABNORMAL HIGH (ref 0.3–1.2)

## 2016-11-11 LAB — I-STAT TROPONIN, ED: TROPONIN I, POC: 0 ng/mL (ref 0.00–0.08)

## 2016-11-11 MED ORDER — DICYCLOMINE HCL 20 MG PO TABS: 20.0000 mg | ORAL_TABLET | Freq: Two times a day (BID) | ORAL | 0 refills | Status: DC

## 2016-11-11 MED ORDER — DICYCLOMINE HCL 10 MG/ML IM SOLN
20.0000 mg | Freq: Once | INTRAMUSCULAR | Status: AC
  Administered 2016-11-11: 20 mg via INTRAMUSCULAR
  Filled 2016-11-11: qty 2

## 2016-11-11 MED ORDER — POTASSIUM CHLORIDE CRYS ER 20 MEQ PO TBCR
40.0000 meq | EXTENDED_RELEASE_TABLET | Freq: Once | ORAL | Status: AC
  Administered 2016-11-11: 40 meq via ORAL
  Filled 2016-11-11: qty 2

## 2016-11-11 MED ORDER — LANSOPRAZOLE 30 MG PO CPDR: 30.0000 mg | DELAYED_RELEASE_CAPSULE | Freq: Every day | ORAL | 0 refills | Status: DC

## 2016-11-11 MED ORDER — SODIUM CHLORIDE 0.9 % IV BOLUS (SEPSIS)
1000.0000 mL | Freq: Once | INTRAVENOUS | Status: AC
  Administered 2016-11-11: 1000 mL via INTRAVENOUS

## 2016-11-11 MED ORDER — GI COCKTAIL ~~LOC~~
30.0000 mL | Freq: Once | ORAL | Status: AC
  Administered 2016-11-11: 30 mL via ORAL
  Filled 2016-11-11: qty 30

## 2016-11-11 NOTE — ED Provider Notes (Signed)
[facility] Provider Note   CSN: [id number] Arrival date & time: 11/10/16  2018     History   Chief Complaint Chief Complaint  Patient presents with  . Chest Pain    HPI Zachary Gutierrez is a 56 y.o. male.  The history is provided by the patient.  Illness  This is a new problem. The current episode started more than 1 week ago. The problem occurs constantly. The problem has not changed since onset.Pertinent negatives include no headaches and no shortness of breath. Associated symptoms comments: Gas and belching constantly have been taking NSAIDs for knee issues while working out and has had intermittent palpitations as well.  Was working out [employer] and has them and had pain in the left shoulder area and came in for an evaluation  . Exacerbated by: rowing machine  Nothing relieves the symptoms. He has tried nothing for the symptoms. The treatment provided no relief.  No DOE, no n/v/d/  No leg pain or swelling. No travel.    Past Medical History:  Diagnosis Date  . ALLERGIC RHINITIS 09/11/2006   Qualifier: Diagnosis of  By: Everett Graffodd, RN, Ellen    . ATTENTION DEFICIT DISORDER, ADULT 12/09/2008   Qualifier: Diagnosis of  By: Tawanna Coolerodd MD, Eugenio HoesJeffrey A   . Carbuncle and furuncle of buttock 11/20/2006   Qualifier: Diagnosis of  By: Tawanna Coolerodd MD, Eugenio HoesJeffrey A   . DYSPEPSIA 03/21/2008   Qualifier: Diagnosis of  By: Amador CunasKwiatkowski  MD, Janett LabellaPeter F   . Extrinsic asthma, unspecified 01/10/2008   Qualifier: Diagnosis of  By: Tawanna Coolerodd MD, Eugenio HoesJeffrey A   . GOUT 09/11/2006   Qualifier: Diagnosis of  By: Everett Graffodd, RN, Ellen    . HYPERTENSION 09/11/2006   Qualifier: Diagnosis of  By: Everett Graffodd, RN, Ellen    . NEPHROLITHIASIS, HX OF 01/31/2008   Qualifier: Diagnosis of  By: Tawanna Coolerodd MD, Eugenio HoesJeffrey A   . PALPITATIONS, HX OF 03/21/2008   Qualifier: Diagnosis of  By: Amador CunasKwiatkowski  MD, Janett LabellaPeter F   . PILONIDAL CYST 01/31/2008   Qualifier: Diagnosis of  By: Tawanna Coolerodd MD, Eugenio HoesJeffrey A   . SEBACEOUS CYST, NECK 09/28/2006   Qualifier: Diagnosis of  By: Tawanna Coolerodd MD, Eugenio HoesJeffrey A   . VIRAL URI 01/05/2008   Qualifier: Diagnosis of  By: Tawanna Coolerodd MD, Eugenio HoesJeffrey A     Patient Active Problem List   Diagnosis Date Noted  . Routine general medical examination at a health care facility 02/10/2014  . NEPHROLITHIASIS, HX OF 01/31/2008  . Gout 09/11/2006  . Essential hypertension 09/11/2006  . ALLERGIC RHINITIS 09/11/2006    Past Surgical History:  Procedure Laterality Date  . nephrolithiasis    . QUADRICEPS TENDON REPAIR     Partial meniscectomy       Home Medications    Prior to Admission medications   Medication Sig Start Date End Date Taking? Authorizing Provider  allopurinol (ZYLOPRIM) 300 MG tablet Take 1 tablet (300 mg total) by mouth daily. 02/24/16   Roderick Peeodd, Jeffrey A, MD  aspirin 81 MG tablet Take 81 mg by mouth daily.    [provider]  dicyclomine (BENTYL) 20 MG tablet Take 1 tablet (20 mg total) by mouth 2 (two) times daily. 11/11/16   Dimitria Ketchum, MD  fexofenadine (ALLEGRA) 180 MG tablet Take 180 mg by mouth daily.    [provider]  lansoprazole (PREVACID) 30 MG capsule Take 1 capsule (30 mg total) by mouth daily at 12 noon. 11/11/16   Remington Highbaugh, MD  losartan-hydrochlorothiazide (HYZAAR) 50-12.5 MG tablet Take 1 tablet by mouth daily. 02/24/16   Roderick Pee, MD    Family History Family History  Problem Relation Age of Onset  . Hypertension Other     Social History Social History  Substance Use Topics  . Smoking status: Never Smoker  . Smokeless tobacco: Never Used  . Alcohol use Yes     Comment: ocassionally     Allergies   Patient has no known allergies.   Review of Systems Review of Systems  Constitutional: Negative for diaphoresis and fever.  Respiratory: Negative for chest tightness and shortness of breath.   Cardiovascular: Positive for palpitations. Negative for leg swelling.  Gastrointestinal:       Gas belching  Neurological: Negative for headaches.    All other systems reviewed and are negative.    Physical Exam Updated Vital Signs BP 119/88   Pulse 63   Temp 98.6 F (37 C) (Oral)   Resp 15   Ht 5\' 6"  (1.676 m)   Wt 67.1 kg (148 lb)   SpO2 99%   BMI 23.89 kg/m   Physical Exam  Constitutional: He is oriented to person, place, and time. He appears well-developed and well-nourished. No distress.  HENT:  Head: Normocephalic and atraumatic.  Mouth/Throat: Oropharynx is clear and moist. No oropharyngeal exudate.  Eyes: Pupils are equal, round, and reactive to light. Conjunctivae are normal.  Neck: Normal range of motion. Neck supple.  Cardiovascular: Normal rate, regular rhythm, normal heart sounds and intact distal pulses.   Pulmonary/Chest: Effort normal and breath sounds normal. He has no wheezes. He has no rales.  Abdominal: Soft. He exhibits no mass. There is no hepatomegaly. There is no tenderness. There is no rebound, no guarding, no tenderness at McBurney's point and negative Murphy's sign.  Very gassy throughout  Musculoskeletal: Normal range of motion.  Neurological: He is alert and oriented to person, place, and time. He displays normal reflexes.  Skin: Skin is warm and dry. Capillary refill takes less than 2 seconds. He is not diaphoretic.  Psychiatric: He has a normal mood and affect.  Nursing note and vitals reviewed.    ED Treatments / Results   Vitals:   11/11/16 0515 11/11/16 0530  BP:  119/88  Pulse:    Resp: 18 15  Temp:    SpO2:      Labs (all labs ordered are listed, but only abnormal results are displayed) Labs Reviewed  BASIC METABOLIC PANEL - Abnormal; Notable for the following:       Result Value   Potassium 3.2 (*)    CO2 20 (*)    Glucose, Bld 140 (*)    BUN 31 (*)    Creatinine, Ser 1.43 (*)    GFR calc non Af Amer 53 (*)    All other components within normal limits  CBC - Abnormal; Notable for the following:    WBC 10.7 (*)    Hemoglobin 17.2 (*)    MCHC 36.2 (*)    All other  components within normal limits  HEPATIC FUNCTION PANEL - Abnormal; Notable for the following:    Total Bilirubin 1.5 (*)    Indirect Bilirubin 1.3 (*)    All other components within normal limits  I-STAT TROPONIN, ED  I-STAT TROPONIN, ED    EKG  EKG Interpretation  Date/Time:  Friday November 11 2016 04:42:17 EDT Ventricular Rate:  55 PR Interval:  164 QRS Duration: 92 QT Interval:  432 QTC Calculation:  414 R Axis:   63 Text Interpretation:  Sinus rhythm Prolonged PR interval Confirmed by Ashlin Hidalgo ([id number]) on 11/11/2016 5:41:32 AM       Radiology Dg Chest 2 View  Result Date: 11/10/2016 CLINICAL DATA:  Patient with chest tightness.  Palpitations. EXAM: CHEST  2 VIEW COMPARISON:  Chest radiograph 03/20/2008 FINDINGS: Stable cardiac and mediastinal contours. No consolidative pulmonary opacities. No pleural effusion or pneumothorax. Regional skeleton is unremarkable. IMPRESSION: No acute cardiopulmonary process. Electronically Signed   By: Annia Belt M.D.   On: 11/10/2016 20:53    Procedures Procedures (including critical care time)  Medications Ordered in ED Medications  sodium chloride 0.9 % bolus 1,000 mL (0 mLs Intravenous Stopped 11/11/16 0543)  potassium chloride SA (K-DUR,KLOR-CON) CR tablet 40 mEq (40 mEq Oral Given 11/11/16 0341)  gi cocktail (Maalox,Lidocaine,Donnatal) (30 mLs Oral Given 11/11/16 0341)  dicyclomine (BENTYL) injection 20 mg (20 mg Intramuscular Given 11/11/16 0342)    Markedly improved post medication.  I highly doubt cardiac etiology.  I suspect GERD, gas with superimposed MSK component.  HEART score 1, low risk for MACE    Final Clinical Impressions(s) / ED Diagnoses   Final diagnoses:  Precordial pain  Abdominal gas pain   Strict return precautions given.  Strict return precautions given for dyspnea on exertion, shortness of breath, swelling or the lips or tongue, chest pain, dyspnea on exertion, new weakness or numbness changes in  vision or speech,  Inability to tolerate liquids or food, changes in voice cough, altered mental status or any concerns. No signs of systemic illness or infection. The patient is nontoxic-appearing on exam and vital signs are within normal limits.  I have EPIC inboxed the patient's PMD to schedule outpatient follow up and outpatient [place] for gall bladder as patient did not want to wait in ED for same which is reasonable as pain is improved and there are no other signs of biliary disease.  I suspect this may be Gilbert's.  Patient will also follow up with Dr. Armanda Magic of cardiology for palpitations and outpatient stress test.    I have reviewed the triage vital signs and the nursing notes. Pertinent labs &imaging results that were available during my care of the patient were reviewed by me and considered in my medical decision making (see chart for details).  After history, exam, and medical workup I feel the patient has been appropriately medically screened and is safe for discharge home. Pertinent diagnoses were discussed with the patient. Patient was given return precautions.  New Prescriptions New Prescriptions   DICYCLOMINE (BENTYL) 20 MG TABLET    Take 1 tablet (20 mg total) by mouth 2 (two) times daily.   LANSOPRAZOLE (PREVACID) 30 MG CAPSULE    Take 1 capsule (30 mg total) by mouth daily at 12 noon.     Donyelle Enyeart, MD 11/11/16 [phone]

## 2017-03-23 DIAGNOSIS — H04123 Dry eye syndrome of bilateral lacrimal glands: Secondary | ICD-10-CM | POA: Diagnosis not present

## 2017-04-24 DIAGNOSIS — H04123 Dry eye syndrome of bilateral lacrimal glands: Secondary | ICD-10-CM | POA: Diagnosis not present

## 2017-05-02 ENCOUNTER — Other Ambulatory Visit: Payer: Self-pay | Admitting: Family Medicine

## 2017-12-06 DIAGNOSIS — H5213 Myopia, bilateral: Secondary | ICD-10-CM | POA: Diagnosis not present

## 2017-12-16 DIAGNOSIS — J0100 Acute maxillary sinusitis, unspecified: Secondary | ICD-10-CM | POA: Diagnosis not present

## 2017-12-16 DIAGNOSIS — I10 Essential (primary) hypertension: Secondary | ICD-10-CM | POA: Diagnosis not present

## 2018-01-16 DIAGNOSIS — B9689 Other specified bacterial agents as the cause of diseases classified elsewhere: Secondary | ICD-10-CM | POA: Diagnosis not present

## 2018-01-16 DIAGNOSIS — J019 Acute sinusitis, unspecified: Secondary | ICD-10-CM | POA: Diagnosis not present

## 2018-01-31 DIAGNOSIS — H16223 Keratoconjunctivitis sicca, not specified as Sjogren's, bilateral: Secondary | ICD-10-CM | POA: Diagnosis not present

## 2018-03-14 ENCOUNTER — Other Ambulatory Visit: Payer: Self-pay | Admitting: Family Medicine

## 2018-03-14 MED ORDER — LOSARTAN POTASSIUM-HCTZ 50-12.5 MG PO TABS: 1.0000 | ORAL_TABLET | Freq: Every day | ORAL | 0 refills | Status: DC

## 2018-03-14 NOTE — Telephone Encounter (Signed)
See medication refill request. Dr. Tawanna Cooler was the PCP. LOV was 02/24/16 NOV scheduled for 05/08/18 with Dr. Jimmey Ralph at [facility].  Last refill was 4/19.  Routing to PCP for consideration.

## 2018-03-14 NOTE — Telephone Encounter (Signed)
Copied from CRM [id number]. Topic: Quick Communication - Rx Refill/Question >> Mar 14, 2018 11:12 AM Crist Infante wrote: Medication: losartan-hydrochlorothiazide (HYZAAR) 50-12.5 MG tablet  Pt used to see Dr Tawanna Cooler.  Pt has TOC appt with Dr Jimmey Ralph on 05/08/2018.  However, pt will be out of his med in 5 days. Pt traveling most of March and requesting a 90 day to get him to this appt  [id number] - [place], [place] - [address] AT [facility] [address] [facility] [address] [phone] (Phone) [phone] (Fax)

## 2018-03-15 NOTE — Telephone Encounter (Signed)
Per chart, this medication has already been filled.

## 2018-03-15 NOTE — Telephone Encounter (Signed)
Will send to Dr Willaim Bane to see if abl to refill. Upcoming appt 05/08/2018 We have not seen this patient since 2018 and Dr Tawanna Cooler is no longer with our office.

## 2018-03-15 NOTE — Telephone Encounter (Signed)
See note

## 2018-05-08 ENCOUNTER — Encounter: Payer: BLUE CROSS/BLUE SHIELD | Admitting: Family Medicine

## 2018-05-30 DIAGNOSIS — Z03818 Encounter for observation for suspected exposure to other biological agents ruled out: Secondary | ICD-10-CM | POA: Diagnosis not present

## 2018-06-07 ENCOUNTER — Ambulatory Visit (INDEPENDENT_AMBULATORY_CARE_PROVIDER_SITE_OTHER): Payer: BLUE CROSS/BLUE SHIELD | Admitting: Family Medicine

## 2018-06-07 ENCOUNTER — Encounter: Payer: Self-pay | Admitting: Family Medicine

## 2018-06-07 DIAGNOSIS — N200 Calculus of kidney: Secondary | ICD-10-CM | POA: Diagnosis not present

## 2018-06-07 DIAGNOSIS — M109 Gout, unspecified: Secondary | ICD-10-CM | POA: Diagnosis not present

## 2018-06-07 DIAGNOSIS — J309 Allergic rhinitis, unspecified: Secondary | ICD-10-CM

## 2018-06-07 DIAGNOSIS — I10 Essential (primary) hypertension: Secondary | ICD-10-CM | POA: Diagnosis not present

## 2018-06-07 MED ORDER — LOSARTAN POTASSIUM-HCTZ 50-12.5 MG PO TABS: 1.0000 | ORAL_TABLET | Freq: Every day | ORAL | 3 refills | Status: DC

## 2018-06-07 MED ORDER — ALLOPURINOL 300 MG PO TABS: 300.0000 mg | ORAL_TABLET | Freq: Every day | ORAL | 3 refills | Status: DC

## 2018-06-07 NOTE — Assessment & Plan Note (Signed)
Stable.  Continue losartan-HCTZ 50-12.5 once daily.

## 2018-06-07 NOTE — Assessment & Plan Note (Signed)
Stable. Continue allopurinol. 

## 2018-06-07 NOTE — Progress Notes (Signed)
Chief Complaint:  Zachary Gutierrez is a 58 y.o. male who presents today for a virtual office visit with a chief complaint of HTN and to transfer care to this office.   Assessment/Plan:  Gout Stable.  He will switch to allopurinol 300 mg every other day.  Will check uric acid level with next blood draw.  Essential hypertension Stable.  Continue losartan-HCTZ 50-12.5 once daily.  Allergic rhinitis Stable.  Continue Allegra 180 mg daily as needed.  History of Urate kidney stone Stable.  Continue allopurinol.  Preventative Healthcare Patient was instructed to return soon for CPE. Health Maintenance Due  Topic Date Due  . Hepatitis C Screening  13-Feb-1960  . HIV Screening  07/15/1975  . COLONOSCOPY  07/15/2010     Subjective:  HPI:  His stable, chronic medical conditions are outlined below:  # Essential Hypertension - On losartan-HCTZ 50-12.5mg  once daily and tolerating well - ROS: No reported chest pain or shortness of breath  # Gout / History of uric acid Kidney Stones - On allopurinol 300mg  daily and tolerating well - No recent flares  # Allergic Rhinitis - On allegra 180mg  daily as needed and tolerating well.   ROS: Per HPI  PMH: He reports that he has never smoked. He has never used smokeless tobacco. He reports current alcohol use. He reports that he does not use drugs.      Objective/Observations  Physical Exam: Gen: NAD, resting comfortably Pulm: Normal work of breathing Neuro: Grossly normal, moves all extremities Psych: Normal affect and thought content  Virtual Visit via Video   I connected with Zachary Gutierrez on 06/07/18 at  9:20 AM EDT by a video enabled telemedicine application and verified that I am speaking with the correct person using two identifiers. I discussed the limitations of evaluation and management by telemedicine and the availability of in person appointments. The patient expressed understanding and agreed to proceed.    Patient location: Home Provider location: [place] [employer] Persons participating in the virtual visit: Myself and Patient     Katina Degree. Jimmey Ralph, MD 06/07/2018 9:46 AM

## 2018-06-07 NOTE — Assessment & Plan Note (Signed)
Stable.  He will switch to allopurinol 300 mg every other day.  Will check uric acid level with next blood draw.

## 2018-06-07 NOTE — Assessment & Plan Note (Signed)
Stable.  Continue Allegra 180 mg daily as needed.

## 2018-07-17 ENCOUNTER — Emergency Department (HOSPITAL_COMMUNITY)
Admission: EM | Admit: 2018-07-17 | Discharge: 2018-07-18 | Disposition: A | Discharge status: Home / Self Care | Payer: BC Managed Care – PPO | Attending: Emergency Medicine | Admitting: Emergency Medicine

## 2018-07-17 ENCOUNTER — Other Ambulatory Visit: Payer: Self-pay

## 2018-07-17 DIAGNOSIS — Y9289 Other specified places as the place of occurrence of the external cause: Secondary | ICD-10-CM | POA: Diagnosis not present

## 2018-07-17 DIAGNOSIS — S022XXA Fracture of nasal bones, initial encounter for closed fracture: Secondary | ICD-10-CM

## 2018-07-17 DIAGNOSIS — Z23 Encounter for immunization: Secondary | ICD-10-CM | POA: Diagnosis not present

## 2018-07-17 DIAGNOSIS — I10 Essential (primary) hypertension: Secondary | ICD-10-CM | POA: Diagnosis not present

## 2018-07-17 DIAGNOSIS — S0990XA Unspecified injury of head, initial encounter: Secondary | ICD-10-CM | POA: Diagnosis present

## 2018-07-17 DIAGNOSIS — S0083XA Contusion of other part of head, initial encounter: Secondary | ICD-10-CM | POA: Diagnosis not present

## 2018-07-17 DIAGNOSIS — Y9355 Activity, bike riding: Secondary | ICD-10-CM | POA: Insufficient documentation

## 2018-07-17 DIAGNOSIS — Z79899 Other long term (current) drug therapy: Secondary | ICD-10-CM | POA: Diagnosis not present

## 2018-07-17 DIAGNOSIS — Y999 Unspecified external cause status: Secondary | ICD-10-CM | POA: Insufficient documentation
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
## 2018-07-18 ENCOUNTER — Emergency Department (HOSPITAL_COMMUNITY): Payer: BC Managed Care – PPO

## 2018-07-18 ENCOUNTER — Other Ambulatory Visit: Payer: Self-pay

## 2018-07-18 DIAGNOSIS — S022XXA Fracture of nasal bones, initial encounter for closed fracture: Secondary | ICD-10-CM | POA: Diagnosis not present

## 2018-07-18 DIAGNOSIS — S0083XA Contusion of other part of head, initial encounter: Secondary | ICD-10-CM | POA: Diagnosis not present

## 2018-07-18 MED ORDER — TETANUS-DIPHTH-ACELL PERTUSSIS 5-2.5-18.5 LF-MCG/0.5 IM SUSP
0.5000 mL | Freq: Once | INTRAMUSCULAR | Status: AC
  Administered 2018-07-18: 02:00:00 0.5 mL via INTRAMUSCULAR
  Filled 2018-07-18: qty 0.5

## 2018-07-18 MED ORDER — CEPHALEXIN 500 MG PO CAPS: 500.0000 mg | ORAL_CAPSULE | Freq: Two times a day (BID) | ORAL | 0 refills | Status: DC
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
## 2018-07-18 NOTE — ED Notes (Signed)
ED Provider at bedside. 

## 2018-07-23 ENCOUNTER — Other Ambulatory Visit: Payer: Self-pay | Admitting: Otolaryngology

## 2018-07-23 DIAGNOSIS — S022XXA Fracture of nasal bones, initial encounter for closed fracture: Secondary | ICD-10-CM | POA: Diagnosis not present

## 2018-07-23 DIAGNOSIS — J343 Hypertrophy of nasal turbinates: Secondary | ICD-10-CM | POA: Diagnosis not present

## 2018-07-23 DIAGNOSIS — J342 Deviated nasal septum: Secondary | ICD-10-CM | POA: Diagnosis not present

## 2018-07-24 ENCOUNTER — Other Ambulatory Visit: Payer: Self-pay

## 2018-07-24 ENCOUNTER — Encounter (HOSPITAL_COMMUNITY): Payer: Self-pay | Admitting: *Deleted

## 2018-07-24 ENCOUNTER — Other Ambulatory Visit (HOSPITAL_COMMUNITY)
Admission: RE | Admit: 2018-07-24 | Discharge: 2018-07-24 | Disposition: A | Discharge status: Home / Self Care | Payer: BC Managed Care – PPO | Source: Ambulatory Visit | Attending: Otolaryngology | Admitting: Otolaryngology

## 2018-07-24 DIAGNOSIS — Z01812 Encounter for preprocedural laboratory examination: Secondary | ICD-10-CM | POA: Diagnosis not present

## 2018-07-24 DIAGNOSIS — Z1159 Encounter for screening for other viral diseases: Secondary | ICD-10-CM | POA: Diagnosis not present

## 2018-07-24 LAB — SARS CORONAVIRUS 2 (TAT 6-24 HRS): SARS Coronavirus 2: NEGATIVE

## 2018-07-24 NOTE — Progress Notes (Signed)
Spoke with pt for pre-op call. Pt denies cardiac history or Diabetes. Pt is treated for HTN.   Pt had Covid 19 test done today. He voices understanding about staying home and quarantine.   Coronavirus Screening  Have you experienced the following symptoms:  Cough NO Fever (>100.8F)  NO Runny nose NO Sore throat NO Difficulty breathing/shortness of breath  NO  Have you or a family member traveled in the last 14 days and where? NO  Patient reminded that hospital visitation restrictions are in effect and the importance of the restrictions.

## 2018-07-24 NOTE — Progress Notes (Signed)
Left phone message for Zachary Gutierrez to schedule appointment for Covid 19 screen prior to procedure 07/25/18.

## 2018-07-25 ENCOUNTER — Ambulatory Visit (HOSPITAL_COMMUNITY)
Admission: RE | Admit: 2018-07-25 | Discharge: 2018-07-25 | Disposition: A | Discharge status: Home / Self Care | Payer: BC Managed Care – PPO | Source: Ambulatory Visit | Attending: Otolaryngology | Admitting: Otolaryngology

## 2018-07-25 ENCOUNTER — Ambulatory Visit (HOSPITAL_COMMUNITY): Payer: BC Managed Care – PPO | Admitting: Certified Registered Nurse Anesthetist

## 2018-07-25 ENCOUNTER — Other Ambulatory Visit: Payer: Self-pay

## 2018-07-25 ENCOUNTER — Encounter (HOSPITAL_COMMUNITY): Payer: Self-pay

## 2018-07-25 ENCOUNTER — Encounter (HOSPITAL_COMMUNITY)
Admission: RE | Disposition: A | Discharge status: Home / Self Care | Payer: Self-pay | Source: Ambulatory Visit | Attending: Otolaryngology

## 2018-07-25 DIAGNOSIS — I10 Essential (primary) hypertension: Secondary | ICD-10-CM | POA: Diagnosis not present

## 2018-07-25 DIAGNOSIS — M109 Gout, unspecified: Secondary | ICD-10-CM | POA: Diagnosis not present

## 2018-07-25 DIAGNOSIS — S022XXA Fracture of nasal bones, initial encounter for closed fracture: Secondary | ICD-10-CM | POA: Diagnosis not present

## 2018-07-25 DIAGNOSIS — Z79899 Other long term (current) drug therapy: Secondary | ICD-10-CM | POA: Insufficient documentation

## 2018-07-25 DIAGNOSIS — J342 Deviated nasal septum: Secondary | ICD-10-CM | POA: Diagnosis not present

## 2018-07-25 DIAGNOSIS — Z87442 Personal history of urinary calculi: Secondary | ICD-10-CM | POA: Insufficient documentation

## 2018-07-25 DIAGNOSIS — J45909 Unspecified asthma, uncomplicated: Secondary | ICD-10-CM | POA: Diagnosis not present

## 2018-07-25 HISTORY — PX: CLOSED REDUCTION NASAL FRACTURE: SHX5365

## 2018-07-25 HISTORY — DX: Personal history of urinary calculi: Z87.442

## 2018-07-25 LAB — BASIC METABOLIC PANEL
Anion gap: 7 (ref 5–15)
BUN: 18 mg/dL (ref 6–20)
CO2: 24 mmol/L (ref 22–32)
Calcium: 9.6 mg/dL (ref 8.9–10.3)
Chloride: 107 mmol/L (ref 98–111)
Creatinine, Ser: 1.02 mg/dL (ref 0.61–1.24)
GFR calc Af Amer: >60 mL/min (ref >60)
GFR calc non Af Amer: >60 mL/min (ref >60)
Glucose, Bld: 107 mg/dL — ABNORMAL HIGH (ref 70–99)
Potassium: 3.6 mmol/L (ref 3.5–5.1)
Sodium: 138 mmol/L (ref 135–145)

## 2018-07-25 LAB — CBC
HCT: 43.1 % (ref 39.0–52.0)
Hemoglobin: 15 g/dL (ref 13.0–17.0)
MCH: 30.9 pg (ref 26.0–34.0)
MCHC: 34.8 g/dL (ref 30.0–36.0)
MCV: 88.9 fL (ref 80.0–100.0)
Platelets: 231 10*3/uL (ref 150–400)
RBC: 4.85 MIL/uL (ref 4.22–5.81)
RDW: 13.2 % (ref 11.5–15.5)
WBC: 3.5 10*3/uL — ABNORMAL LOW (ref 4.0–10.5)
nRBC: 0 % (ref 0.0–0.2)

## 2018-07-25 SURGERY — CLOSED REDUCTION, FRACTURE, NASAL BONE
Anesthesia: General | Site: Nose | Laterality: Bilateral

## 2018-07-25 MED ORDER — PROPOFOL 10 MG/ML IV BOLUS
INTRAVENOUS | Status: DC | PRN
  Administered 2018-07-25: 130 mg via INTRAVENOUS

## 2018-07-25 MED ORDER — SUCCINYLCHOLINE CHLORIDE 200 MG/10ML IV SOSY
PREFILLED_SYRINGE | INTRAVENOUS | Status: DC | PRN
  Administered 2018-07-25: 120 mg via INTRAVENOUS

## 2018-07-25 MED ORDER — FENTANYL CITRATE (PF) 250 MCG/5ML IJ SOLN
INTRAMUSCULAR | Status: AC
  Filled 2018-07-25: qty 5

## 2018-07-25 MED ORDER — HYDROMORPHONE HCL 1 MG/ML IJ SOLN: 0.2500 mg | INTRAMUSCULAR | Status: DC | PRN

## 2018-07-25 MED ORDER — FENTANYL CITRATE (PF) 100 MCG/2ML IJ SOLN
INTRAMUSCULAR | Status: DC | PRN
  Administered 2018-07-25: 100 ug via INTRAVENOUS

## 2018-07-25 MED ORDER — ACETAMINOPHEN 10 MG/ML IV SOLN: 1000.0000 mg | Freq: Once | INTRAVENOUS | Status: DC | PRN

## 2018-07-25 MED ORDER — PROMETHAZINE HCL 25 MG/ML IJ SOLN: 6.2500 mg | INTRAMUSCULAR | Status: DC | PRN

## 2018-07-25 MED ORDER — EPHEDRINE SULFATE-NACL 50-0.9 MG/10ML-% IV SOSY
PREFILLED_SYRINGE | INTRAVENOUS | Status: DC | PRN
  Administered 2018-07-25: 10 mg via INTRAVENOUS
  Administered 2018-07-25: 5 mg via INTRAVENOUS

## 2018-07-25 MED ORDER — OXYMETAZOLINE HCL 0.05 % NA SOLN
NASAL | Status: DC | PRN
  Administered 2018-07-25: 2 via NASAL

## 2018-07-25 MED ORDER — ACETAMINOPHEN 325 MG PO TABS
ORAL_TABLET | ORAL | Status: AC
  Filled 2018-07-25: qty 2

## 2018-07-25 MED ORDER — MEPERIDINE HCL 25 MG/ML IJ SOLN: 6.2500 mg | INTRAMUSCULAR | Status: DC | PRN

## 2018-07-25 MED ORDER — ACETAMINOPHEN 160 MG/5ML PO SOLN: 325.0000 mg | Freq: Once | ORAL | Status: AC | PRN

## 2018-07-25 MED ORDER — MIDAZOLAM HCL 5 MG/5ML IJ SOLN
INTRAMUSCULAR | Status: DC | PRN
  Administered 2018-07-25: 2 mg via INTRAVENOUS

## 2018-07-25 MED ORDER — ACETAMINOPHEN 325 MG PO TABS
325.0000 mg | ORAL_TABLET | Freq: Once | ORAL | Status: AC | PRN
  Administered 2018-07-25: 650 mg via ORAL

## 2018-07-25 MED ORDER — MUPIROCIN 2 % EX OINT
TOPICAL_OINTMENT | CUTANEOUS | Status: AC
  Filled 2018-07-25: qty 22

## 2018-07-25 MED ORDER — LIDOCAINE-EPINEPHRINE 1 %-1:100000 IJ SOLN
INTRAMUSCULAR | Status: DC | PRN
  Administered 2018-07-25: 10 mL

## 2018-07-25 MED ORDER — OXYMETAZOLINE HCL 0.05 % NA SOLN
NASAL | Status: DC | PRN
  Administered 2018-07-25: 1

## 2018-07-25 MED ORDER — OXYMETAZOLINE HCL 0.05 % NA SOLN
NASAL | Status: AC
  Filled 2018-07-25: qty 30

## 2018-07-25 MED ORDER — LIDOCAINE-EPINEPHRINE 1 %-1:100000 IJ SOLN
INTRAMUSCULAR | Status: AC
  Filled 2018-07-25: qty 1

## 2018-07-25 MED ORDER — LIDOCAINE 2% (20 MG/ML) 5 ML SYRINGE
INTRAMUSCULAR | Status: DC | PRN
  Administered 2018-07-25: 50 mg via INTRAVENOUS

## 2018-07-25 MED ORDER — 0.9 % SODIUM CHLORIDE (POUR BTL) OPTIME
TOPICAL | Status: DC | PRN
  Administered 2018-07-25: 1000 mL

## 2018-07-25 MED ORDER — ONDANSETRON HCL 4 MG/2ML IJ SOLN
INTRAMUSCULAR | Status: DC | PRN
  Administered 2018-07-25: 4 mg via INTRAVENOUS

## 2018-07-25 MED ORDER — DEXAMETHASONE SODIUM PHOSPHATE 10 MG/ML IJ SOLN
INTRAMUSCULAR | Status: DC | PRN
  Administered 2018-07-25: 10 mg via INTRAVENOUS

## 2018-07-25 MED ORDER — LACTATED RINGERS IV SOLN: INTRAVENOUS | Status: DC

## 2018-07-25 MED ORDER — PHENYLEPHRINE 40 MCG/ML (10ML) SYRINGE FOR IV PUSH (FOR BLOOD PRESSURE SUPPORT)
PREFILLED_SYRINGE | INTRAVENOUS | Status: DC | PRN
  Administered 2018-07-25: 120 ug via INTRAVENOUS
  Administered 2018-07-25: 40 ug via INTRAVENOUS
  Administered 2018-07-25: 80 ug via INTRAVENOUS

## 2018-07-25 MED ORDER — MIDAZOLAM HCL 2 MG/2ML IJ SOLN
INTRAMUSCULAR | Status: AC
  Filled 2018-07-25: qty 2

## 2018-07-25 MED ORDER — LACTATED RINGERS IV SOLN
INTRAVENOUS | Status: DC
  Administered 2018-07-25: 10:00:00 via INTRAVENOUS

## 2018-07-25 MED ORDER — MUPIROCIN 2 % EX OINT
TOPICAL_OINTMENT | CUTANEOUS | Status: DC | PRN
  Administered 2018-07-25: 1 via TOPICAL

## 2018-07-25 SURGICAL SUPPLY — 35 items
APL SKNCLS STERI-STRIP NONHPOA (GAUZE/BANDAGES/DRESSINGS) ×1
BENZOIN TINCTURE PRP APPL 2/3 (GAUZE/BANDAGES/DRESSINGS) ×2 IMPLANT
BLADE SURG 15 STRL LF DISP TIS (BLADE) IMPLANT
BLADE SURG 15 STRL SS (BLADE)
CANISTER SUCT 3000ML PPV (MISCELLANEOUS) ×3 IMPLANT
CLOSURE WOUND 1/2 X4 (GAUZE/BANDAGES/DRESSINGS) ×1
COVER BACK TABLE 60X90IN (DRAPES) ×3 IMPLANT
COVER MAYO STAND STRL (DRAPES) ×1 IMPLANT
COVER WAND RF STERILE (DRAPES) ×3 IMPLANT
DRAPE HALF SHEET 40X57 (DRAPES) IMPLANT
GAUZE 4X4 16PLY RFD (DISPOSABLE) ×3 IMPLANT
GAUZE SPONGE 2X2 8PLY STRL LF (GAUZE/BANDAGES/DRESSINGS) ×1 IMPLANT
GLOVE BIO SURGEON STRL SZ7.5 (GLOVE) ×5 IMPLANT
GLOVE BIOGEL PI IND STRL 7.5 (GLOVE) IMPLANT
GLOVE BIOGEL PI INDICATOR 7.5 (GLOVE) ×2
GOWN STRL REUS W/ TWL LRG LVL3 (GOWN DISPOSABLE) ×2 IMPLANT
GOWN STRL REUS W/TWL LRG LVL3 (GOWN DISPOSABLE) ×6
KIT BASIN OR (CUSTOM PROCEDURE TRAY) ×3 IMPLANT
KIT TURNOVER KIT B (KITS) ×3 IMPLANT
NDL HYPO 25GX1X1/2 BEV (NEEDLE) ×1 IMPLANT
NEEDLE HYPO 25GX1X1/2 BEV (NEEDLE) ×3 IMPLANT
NS IRRIG 1000ML POUR BTL (IV SOLUTION) ×3 IMPLANT
PAD ARMBOARD 7.5X6 YLW CONV (MISCELLANEOUS) ×6 IMPLANT
PATTIES SURGICAL .5 X3 (DISPOSABLE) ×3 IMPLANT
POSITIONER HEAD DONUT 9IN (MISCELLANEOUS) IMPLANT
SPLINT NASAL DOYLE BI-VL (GAUZE/BANDAGES/DRESSINGS) ×2 IMPLANT
SPLINT NASAL THERMO PLAST (MISCELLANEOUS) ×3 IMPLANT
SPONGE GAUZE 2X2 STER 10/PKG (GAUZE/BANDAGES/DRESSINGS) ×2
STRIP CLOSURE SKIN 1/2X4 (GAUZE/BANDAGES/DRESSINGS) ×1 IMPLANT
SUT CHROMIC GUT 2 0 PS 2 27 (SUTURE) ×2 IMPLANT
SYR CONTROL 10ML LL (SYRINGE) ×3 IMPLANT
TOWEL GREEN STERILE FF (TOWEL DISPOSABLE) ×3 IMPLANT
TUBE CONNECTING 12'X1/4 (SUCTIONS) ×1
TUBE CONNECTING 12X1/4 (SUCTIONS) ×2 IMPLANT
WATER STERILE IRR 1000ML POUR (IV SOLUTION) ×1 IMPLANT

## 2018-07-25 NOTE — Anesthesia Preprocedure Evaluation (Addendum)
Anesthesia Evaluation  Patient identified by MRN, date of birth, ID band Patient awake    Reviewed: Allergy & Precautions, NPO status , Patient's Chart, lab work & pertinent test results  Airway Mallampati: II  TM Distance: >3 FB Neck ROM: Full    Dental  (+) Teeth Intact, Dental Advisory Given   Pulmonary asthma ,    breath sounds clear to auscultation       Cardiovascular hypertension, Pt. on medications  Rhythm:Regular Rate:Normal     Neuro/Psych negative neurological ROS     GI/Hepatic negative GI ROS, Neg liver ROS,   Endo/Other  negative endocrine ROS  Renal/GU      Musculoskeletal negative musculoskeletal ROS (+)   Abdominal Normal abdominal exam  (+)   Peds  Hematology negative hematology ROS (+)   Anesthesia Other Findings   Reproductive/Obstetrics                            Lab Results  Component Value Date   WBC 10.7 (H) 11/10/2016   HGB 17.2 (H) 11/10/2016   HCT 47.5 11/10/2016   MCV 87.8 11/10/2016   PLT 286 11/10/2016   Lab Results  Component Value Date   CREATININE 1.43 (H) 11/10/2016   BUN 31 (H) 11/10/2016   NA 136 11/10/2016   K 3.2 (L) 11/10/2016   CL 103 11/10/2016   CO2 20 (L) 11/10/2016   No results found for: INR, PROTIME  EKG: sinus bradycardia.  Anesthesia Physical Anesthesia Plan  ASA: II  Anesthesia Plan: General   Post-op Pain Management:    Induction: Intravenous  PONV Risk Score and Plan: 3 and Ondansetron, Dexamethasone and Midazolam  Airway Management Planned: Oral ETT  Additional Equipment: None  Intra-op Plan:   Post-operative Plan: Extubation in OR  Informed Consent: I have reviewed the patients History and Physical, chart, labs and discussed the procedure including the risks, benefits and alternatives for the proposed anesthesia with the patient or authorized representative who has indicated his/her understanding and  acceptance.     Dental advisory given  Plan Discussed with: CRNA  Anesthesia Plan Comments:        Anesthesia Quick Evaluation

## 2018-07-25 NOTE — Anesthesia Procedure Notes (Signed)
Procedure Name: Intubation Date/Time: 07/25/2018 11:31 AM Performed by: Candis Shine, CRNA Pre-anesthesia Checklist: Patient identified, Emergency Drugs available, Suction available and Patient being monitored Patient Re-evaluated:Patient Re-evaluated prior to induction Oxygen Delivery Method: Circle System Utilized Preoxygenation: Pre-oxygenation with 100% oxygen Induction Type: IV induction Laryngoscope Size: Mac and 4 Grade View: Grade I Tube type: Oral Tube size: 7.5 mm Number of attempts: 1 Airway Equipment and Method: Stylet Placement Confirmation: ETT inserted through vocal cords under direct vision,  positive ETCO2 and breath sounds checked- equal and bilateral Secured at: 22 cm Tube secured with: Tape Dental Injury: Teeth and Oropharynx as per pre-operative assessment

## 2018-07-25 NOTE — Anesthesia Postprocedure Evaluation (Signed)
Anesthesia Post Note  Patient: Zachary Gutierrez  Procedure(s) Performed: CLOSED REDUCTION NASAL FRACTURE (Bilateral Nose)     Patient location during evaluation: PACU Anesthesia Type: General Level of consciousness: awake and alert Pain management: pain level controlled Vital Signs Assessment: post-procedure vital signs reviewed and stable Respiratory status: spontaneous breathing, nonlabored ventilation, respiratory function stable and patient connected to nasal cannula oxygen Cardiovascular status: blood pressure returned to baseline and stable Postop Assessment: no apparent nausea or vomiting Anesthetic complications: no    Last Vitals:  Vitals:   07/25/18 1230 07/25/18 1245  BP: (!) 140/101 (!) 142/98  Pulse: 62 65  Resp: 15 20  Temp:  36.8 C  SpO2: 99% 99%    Last Pain:  Vitals:   07/25/18 1245  TempSrc:   PainSc: 0-No pain                 Effie Berkshire

## 2018-07-25 NOTE — Transfer of Care (Signed)
Immediate Anesthesia Transfer of Care Note  Patient: Zachary Gutierrez  Procedure(s) Performed: CLOSED REDUCTION NASAL FRACTURE (Bilateral Nose)  Patient Location: PACU  Anesthesia Type:General  Level of Consciousness: awake, alert  and oriented  Airway & Oxygen Therapy: Patient Spontanous Breathing and Patient connected to face mask oxygen  Post-op Assessment: Report given to RN and Post -op Vital signs reviewed and stable  Post vital signs: Reviewed and stable  Last Vitals:  Vitals Value Taken Time  BP    Temp    Pulse 70 07/25/18 1212  Resp 19 07/25/18 1212  SpO2 100 % 07/25/18 1212  Vitals shown include unvalidated device data.  Last Pain:  Vitals:   07/25/18 0952  TempSrc:   PainSc: 0-No pain         Complications: No apparent anesthesia complications
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
## 2018-07-25 NOTE — Brief Op Note (Signed)
07/25/2018  12:00 PM  PATIENT:  Zachary Gutierrez  58 y.o. male  PRE-OPERATIVE DIAGNOSIS:  nasal fracture  POST-OPERATIVE DIAGNOSIS:  nasal fracture  PROCEDURE:  Procedure(s): CLOSED REDUCTION NASAL FRACTURE (Bilateral)  SURGEON:  Surgeon(s) and Role:    Melida Quitter, MD - Primary  PHYSICIAN ASSISTANT:   ASSISTANTS: none   ANESTHESIA:   general  EBL: None  BLOOD ADMINISTERED:none  DRAINS: none   LOCAL MEDICATIONS USED:  NONE  SPECIMEN:  No Specimen  DISPOSITION OF SPECIMEN:  N/A  COUNTS:  YES  TOURNIQUET:  * No tourniquets in log *  DICTATION: .Other Dictation: Dictation Number [id number]  PLAN OF CARE: Discharge to home after PACU  PATIENT DISPOSITION:  PACU - hemodynamically stable.   Delay start of Pharmacological VTE agent (>24hrs) due to surgical blood loss or risk of bleeding: no
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
## 2018-07-26 ENCOUNTER — Encounter (HOSPITAL_COMMUNITY): Payer: Self-pay | Admitting: Otolaryngology

## 2018-09-04 DIAGNOSIS — Z03818 Encounter for observation for suspected exposure to other biological agents ruled out: Secondary | ICD-10-CM | POA: Diagnosis not present

## 2019-02-19 DIAGNOSIS — H04123 Dry eye syndrome of bilateral lacrimal glands: Secondary | ICD-10-CM | POA: Diagnosis not present

## 2019-06-12 DIAGNOSIS — H16223 Keratoconjunctivitis sicca, not specified as Sjogren's, bilateral: Secondary | ICD-10-CM | POA: Diagnosis not present

## 2019-06-15 ENCOUNTER — Other Ambulatory Visit: Payer: Self-pay | Admitting: Family Medicine

## 2019-09-09 ENCOUNTER — Other Ambulatory Visit: Payer: Self-pay | Admitting: Family Medicine

## 2019-09-10 DIAGNOSIS — H04123 Dry eye syndrome of bilateral lacrimal glands: Secondary | ICD-10-CM | POA: Diagnosis not present

## 2019-09-11 ENCOUNTER — Other Ambulatory Visit: Payer: Self-pay | Admitting: Family Medicine

## 2019-09-25 DIAGNOSIS — H04123 Dry eye syndrome of bilateral lacrimal glands: Secondary | ICD-10-CM | POA: Diagnosis not present

## 2020-02-13 DIAGNOSIS — Z20828 Contact with and (suspected) exposure to other viral communicable diseases: Secondary | ICD-10-CM | POA: Diagnosis not present

## 2020-03-17 ENCOUNTER — Other Ambulatory Visit: Payer: Self-pay | Admitting: Family Medicine

## 2020-03-18 NOTE — Telephone Encounter (Signed)
I have scheduled pts appointment for 4/25. First available for a physical in the morning. Can we refill pt medication up to his appt? Please advise.

## 2020-03-18 NOTE — Telephone Encounter (Signed)
LAST APPOINTMENT DATE: 06/07/2018  NEXT APPOINTMENT DATE: Visit date not found    LAST REFILL: 09/11/2019  QTY: 90 0 ref   Left voice massage to schedule appointment with PCP

## 2020-05-11 ENCOUNTER — Encounter: Payer: Self-pay | Admitting: Family Medicine

## 2020-05-11 ENCOUNTER — Other Ambulatory Visit: Payer: Self-pay

## 2020-05-11 ENCOUNTER — Ambulatory Visit (INDEPENDENT_AMBULATORY_CARE_PROVIDER_SITE_OTHER): Payer: BC Managed Care – PPO | Admitting: Family Medicine

## 2020-05-11 VITALS — BP 139/93 | HR 69 | Temp 98.1°F | Ht 65.5 in | Wt 147.8 lb

## 2020-05-11 DIAGNOSIS — J309 Allergic rhinitis, unspecified: Secondary | ICD-10-CM | POA: Diagnosis not present

## 2020-05-11 DIAGNOSIS — R739 Hyperglycemia, unspecified: Secondary | ICD-10-CM

## 2020-05-11 DIAGNOSIS — Z0001 Encounter for general adult medical examination with abnormal findings: Secondary | ICD-10-CM

## 2020-05-11 DIAGNOSIS — I10 Essential (primary) hypertension: Secondary | ICD-10-CM

## 2020-05-11 DIAGNOSIS — Z125 Encounter for screening for malignant neoplasm of prostate: Secondary | ICD-10-CM

## 2020-05-11 DIAGNOSIS — M109 Gout, unspecified: Secondary | ICD-10-CM | POA: Diagnosis not present

## 2020-05-11 DIAGNOSIS — N200 Calculus of kidney: Secondary | ICD-10-CM

## 2020-05-11 DIAGNOSIS — Z1211 Encounter for screening for malignant neoplasm of colon: Secondary | ICD-10-CM | POA: Diagnosis not present

## 2020-05-11 DIAGNOSIS — Z13220 Encounter for screening for lipoid disorders: Secondary | ICD-10-CM | POA: Diagnosis not present

## 2020-05-11 LAB — CBC
HCT: 46.3 % (ref 39.0–52.0)
Hemoglobin: 16 g/dL (ref 13.0–17.0)
MCHC: 34.5 g/dL (ref 30.0–36.0)
MCV: 90.2 fl (ref 78.0–100.0)
Platelets: 230 10*3/uL (ref 150.0–400.0)
RBC: 5.13 Mil/uL (ref 4.22–5.81)
RDW: 12.9 % (ref 11.5–15.5)
WBC: 4.1 10*3/uL (ref 4.0–10.5)

## 2020-05-11 LAB — COMPREHENSIVE METABOLIC PANEL
ALT: 19 U/L (ref 0–53)
AST: 19 U/L (ref 0–37)
Albumin: 4.2 g/dL (ref 3.5–5.2)
Alkaline Phosphatase: 73 U/L (ref 39–117)
BUN: 22 mg/dL (ref 6–23)
CO2: 25 mEq/L (ref 19–32)
Calcium: 10.1 mg/dL (ref 8.4–10.5)
Chloride: 107 mEq/L (ref 96–112)
Creatinine, Ser: 1.13 mg/dL (ref 0.40–1.50)
GFR: 70.99 mL/min (ref >60.00)
Glucose, Bld: 117 mg/dL — ABNORMAL HIGH (ref 70–99)
Potassium: 3.8 mEq/L (ref 3.5–5.1)
Sodium: 141 mEq/L (ref 135–145)
Total Bilirubin: 1.7 mg/dL — ABNORMAL HIGH (ref 0.2–1.2)
Total Protein: 6.8 g/dL (ref 6.0–8.3)

## 2020-05-11 LAB — HEMOGLOBIN A1C: Hgb A1c MFr Bld: 5.6 % (ref 4.6–6.5)

## 2020-05-11 LAB — LIPID PANEL
Cholesterol: 167 mg/dL (ref 0–200)
HDL: 55.2 mg/dL (ref >39.00)
LDL Cholesterol: 97 mg/dL (ref 0–99)
NonHDL: 112.22
Total CHOL/HDL Ratio: 3
Triglycerides: 77 mg/dL (ref 0.0–149.0)
VLDL: 15.4 mg/dL (ref 0.0–40.0)

## 2020-05-11 LAB — URIC ACID: Uric Acid, Serum: 6.9 mg/dL (ref 4.0–7.8)

## 2020-05-11 LAB — TSH: TSH: 1.73 u[IU]/mL (ref 0.35–4.50)

## 2020-05-11 LAB — PSA: PSA: 1.3 ng/mL (ref 0.10–4.00)

## 2020-05-11 MED ORDER — LOSARTAN POTASSIUM-HCTZ 50-12.5 MG PO TABS: 1.0000 | ORAL_TABLET | Freq: Every day | ORAL | 0 refills | Status: DC

## 2020-05-11 NOTE — Assessment & Plan Note (Signed)
Stable on over-the-counter Allegra 180 mg daily.

## 2020-05-11 NOTE — Patient Instructions (Signed)
It was very nice to see you today!  We will check blood work today.  I will also place a referral for colonoscopy.  Please continue the good work with your diet and exercise.  I will see back in year.  Come back to see me sooner if needed.  Take care, Dr Jimmey Ralph  PLEASE NOTE:  If you had any lab tests please let us know if you have not heard back within a few days. You may see your results on mychart before we have a chance to review them but we will give you a call once they are reviewed by [place]. If we ordered any referrals today, please let us know if you have not heard from their office within the next week.   Please try these tips to maintain a healthy lifestyle:   Eat at least 3 REAL meals and 1-2 snacks per day.  Aim for no more than 5 hours between eating.  If you eat breakfast, please do so within one hour of getting up.    Each meal should contain half fruits/vegetables, one quarter protein, and one quarter carbs (no bigger than a computer mouse)   Cut down on sweet beverages. This includes juice, soda, and sweet tea.     Drink at least 1 glass of water with each meal and aim for at least 8 glasses per day   Exercise at least 150 minutes every week.    Preventive Care 107-6 Years Old, Male Preventive care refers to lifestyle choices and visits with your health care provider that can promote health and wellness. This includes:  A yearly physical exam. This is also called an annual wellness visit.  Regular dental and eye exams.  Immunizations.  Screening for certain conditions.  Healthy lifestyle choices, such as: ? Eating a healthy diet. ? Getting regular exercise. ? Not using drugs or products that contain nicotine and tobacco. ? Limiting alcohol use. What can I expect for my preventive care visit? Physical exam Your health care provider will check your:  Height and weight. These may be used to calculate your BMI (body mass index). BMI is a measurement  that tells if you are at a healthy weight.  Heart rate and blood pressure.  Body temperature.  Skin for abnormal spots. Counseling Your health care provider may ask you questions about your:  Past medical problems.  Family's medical history.  Alcohol, tobacco, and drug use.  Emotional well-being.  Home life and relationship well-being.  Sexual activity.  Diet, exercise, and sleep habits.  Work and work Astronomer.  Access to firearms. What immunizations do I need? Vaccines are usually given at various ages, according to a schedule. Your health care provider will recommend vaccines for you based on your age, medical history, and lifestyle or other factors, such as travel or where you work.   What tests do I need? Blood tests  Lipid and cholesterol levels. These may be checked every 5 years, or more often if you are over 40 years old.  Hepatitis C test.  Hepatitis B test. Screening  Lung cancer screening. You may have this screening every year starting at age 5 if you have a 30-pack-year history of smoking and currently smoke or have quit within the past 15 years.  Prostate cancer screening. Recommendations will vary depending on your family history and other risks.  Genital exam to check for testicular cancer or hernias.  Colorectal cancer screening. ? All adults should have this screening starting at  age 39 and continuing until age 106. ? Your health care provider may recommend screening at age 59 if you are at increased risk. ? You will have tests every 1-10 years, depending on your results and the type of screening test.  Diabetes screening. ? This is done by checking your blood sugar (glucose) after you have not eaten for a while (fasting). ? You may have this done every 1-3 years.  STD (sexually transmitted disease) testing, if you are at risk. Follow these instructions at home: Eating and drinking  Eat a diet that includes fresh fruits and vegetables,  whole grains, lean protein, and low-fat dairy products.  Take vitamin and mineral supplements as recommended by your health care provider.  Do not drink alcohol if your health care provider tells you not to drink.  If you drink alcohol: ? Limit how much you have to 0-2 drinks a day. ? Be aware of how much alcohol is in your drink. In the [place] one drink equals one 12 oz bottle of beer (355 mL), one 5 oz glass of wine (148 mL), or one 1 oz glass of hard liquor (44 mL).   Lifestyle  Take daily care of your teeth and gums. Brush your teeth every morning and night with fluoride toothpaste. Floss one time each day.  Stay active. Exercise for at least 30 minutes 5 or more days each week.  Do not use any products that contain nicotine or tobacco, such as cigarettes, e-cigarettes, and chewing tobacco. If you need help quitting, ask your health care provider.  Do not use drugs.  If you are sexually active, practice safe sex. Use a condom or other form of protection to prevent STIs (sexually transmitted infections).  If told by your health care provider, take low-dose aspirin daily starting at age 40.  Find healthy ways to cope with stress, such as: ? Meditation, yoga, or listening to music. ? Journaling. ? Talking to a trusted person. ? Spending time with friends and family. Safety  Always wear your seat belt while driving or riding in a vehicle.  Do not drive: ? If you have been drinking alcohol. Do not ride with someone who has been drinking. ? When you are tired or distracted. ? While texting.  Wear a helmet and other protective equipment during sports activities.  If you have firearms in your house, make sure you follow all gun safety procedures. What's next?  Go to your health care provider once a year for an annual wellness visit.  Ask your health care provider how often you should have your eyes and teeth checked.  Stay up to date on all vaccines. This information is not  intended to replace advice given to you by your health care provider. Make sure you discuss any questions you have with your health care provider. Document Revised: 10/02/2018 Document Reviewed: 12/28/2017 Elsevier Patient Education  2021 [employer].

## 2020-05-11 NOTE — Progress Notes (Signed)
Chief Complaint:  Zachary Gutierrez is a 60 y.o. male who presents today for his annual comprehensive physical exam.    Assessment/Plan:  Chronic Problems Addressed Today: Allergic rhinitis Stable on over-the-counter Allegra 180 mg daily.  Essential hypertension Slightly above goal though at goal.  Continue losartan-HCTZ 50-12.5 daily.  Check labs today.  Gout Patient with no known gout flares.  We will check uric acid level today.  He is on allopurinol 300 mg daily.  History of Urate kidney stone Patient is not sure if allopurinol has been helping to break kidney stones or not.  He has 1 or 2/year.  We will start taking allopurinol daily to see if this helps.  He does not notice any significant benefit with allopurinol but will stop.  Preventative Healthcare: Ordered colonoscopy referral.  Check labs today.  Up-to-date on vaccines.  Patient Counseling(The following topics were reviewed and/or handout was given):  -Nutrition: Stressed importance of moderation in sodium/caffeine intake, saturated fat and cholesterol, caloric balance, sufficient intake of fresh fruits, vegetables, and fiber.  -Stressed the importance of regular exercise.   -Substance Abuse: Discussed cessation/primary prevention of tobacco, alcohol, or other drug use; driving or other dangerous activities under the influence; availability of treatment for abuse.   -Injury prevention: Discussed safety belts, safety helmets, smoke detector, smoking near bedding or upholstery.   -Sexuality: Discussed sexually transmitted diseases, partner selection, use of condoms, avoidance of unintended pregnancy and contraceptive alternatives.   -Dental health: Discussed importance of regular tooth brushing, flossing, and dental visits.  -Health maintenance and immunizations reviewed. Please refer to Health maintenance section.  Return to care in 1 year for next preventative visit.     Subjective:  HPI:  He has no acute  complaints today.   Lifestyle Diet: Balanced.  Exercise: Cycling. Goes to the gym.   Depression screen PHQ 2/9 05/11/2020  Decreased Interest 0  Down, Depressed, Hopeless 0  PHQ - 2 Score 0   Health Maintenance Due  Topic Date Due  . Hepatitis C Screening  Never done  . HIV Screening  Never done  . COLONOSCOPY (Pts 45-74yrs Insurance coverage will need to be confirmed)  Never done  . COVID-19 Vaccine (3 - Booster for Pfizer series) 10/23/2019    ROS: Per HPI, otherwise a complete review of systems was negative.   PMH:  The following were reviewed and entered/updated in epic: Past Medical History:  Diagnosis Date  . ALLERGIC RHINITIS 09/11/2006   Qualifier: Diagnosis of  By: Everett Graff    . ATTENTION DEFICIT DISORDER, ADULT 12/09/2008   Qualifier: Diagnosis of  By: Tawanna Cooler MD, Eugenio Hoes   . Carbuncle and furuncle of buttock 11/20/2006   Qualifier: Diagnosis of  By: Tawanna Cooler MD, Eugenio Hoes DYSPEPSIA 03/21/2008   Qualifier: Diagnosis of  By: Amador Cunas  MD, Janett Labella   . Extrinsic asthma, unspecified 01/10/2008   Qualifier: Diagnosis of  By: Tawanna Cooler MD, Eugenio Hoes   . GOUT 09/11/2006   Qualifier: Diagnosis of  By: Everett Graff    . History of kidney stones   . HYPERTENSION 09/11/2006   Qualifier: Diagnosis of  By: Everett Graff    . NEPHROLITHIASIS, HX OF 01/31/2008   Qualifier: Diagnosis of  By: Tawanna Cooler MD, Eugenio Hoes   . PALPITATIONS, HX OF 03/21/2008   Qualifier: Diagnosis of  By: Amador Cunas  MD, Janett Labella   . PILONIDAL CYST 01/31/2008   Qualifier: Diagnosis of  By: Tawanna Cooler MD, Tinnie Gens  A   . SEBACEOUS CYST, NECK 09/28/2006   Qualifier: Diagnosis of  By: Tawanna Cooler MD, Eugenio Hoes   . VIRAL URI 01/05/2008   Qualifier: Diagnosis of  By: Tawanna Cooler MD, Eugenio Hoes    Patient Active Problem List   Diagnosis Date Noted  . History of Urate kidney stone 01/31/2008  . Gout 09/11/2006  . Essential hypertension 09/11/2006  . Allergic rhinitis 09/11/2006   Past Surgical History:  Procedure Laterality  Date  . CLOSED REDUCTION NASAL FRACTURE Bilateral 07/25/2018   Procedure: CLOSED REDUCTION NASAL FRACTURE;  Surgeon: Christia Reading, MD;  Location: [facility] OR;  Service: ENT;  Laterality: Bilateral;  . deviated septum repair    . nephrolithiasis    . QUADRICEPS TENDON REPAIR     Partial meniscectomy    Family History  Problem Relation Age of Onset  . Hypertension Other     Medications- reviewed and updated Current Outpatient Medications  Medication Sig Dispense Refill  . allopurinol (ZYLOPRIM) 300 MG tablet Take 1 tablet (300 mg total) by mouth daily. (Patient taking differently: Take 300 mg by mouth as needed (for kidney stones).) 90 tablet 3  . losartan-hydrochlorothiazide (HYZAAR) 50-12.5 MG tablet Take 1 tablet by mouth daily. 90 tablet 0   No current facility-administered medications for this visit.    Allergies-reviewed and updated No Known Allergies  Social History   Socioeconomic History  . Marital status: Married    Spouse name: Not on file  . Number of children: Not on file  . Years of education: Not on file  . Highest education level: Not on file  Occupational History  . Not on file  Tobacco Use  . Smoking status: Never Smoker  . Smokeless tobacco: Never Used  Vaping Use  . Vaping Use: Never used  Substance and Sexual Activity  . Alcohol use: Yes    Comment: ocassionally  . Drug use: No  . Sexual activity: Not on file  Other Topics Concern  . Not on file  Social History Narrative  . Not on file   Social Determinants of Health   Financial Resource Strain: Not on file  Food Insecurity: Not on file  Transportation Needs: Not on file  Physical Activity: Not on file  Stress: Not on file  Social Connections: Not on file        Objective:  Physical Exam: BP (!) 139/93   Pulse 69   Temp 98.1 F (36.7 C) (Temporal)   Ht 5' 5.5" (1.664 m)   Wt 147 lb 12.8 oz (67 kg)   SpO2 97%   BMI 24.22 kg/m   Body mass index is 24.22 kg/m. Wt Readings from Last 3  Encounters:  05/11/20 147 lb 12.8 oz (67 kg)  07/25/18 150 lb (68 kg)  11/10/16 148 lb (67.1 kg)   Gen: NAD, resting comfortably HEENT: TMs normal bilaterally. OP clear. No thyromegaly noted.  CV: RRR with no murmurs appreciated Pulm: NWOB, CTAB with no crackles, wheezes, or rhonchi GI: Normal bowel sounds present. Soft, Nontender, Nondistended. MSK: no edema, cyanosis, or clubbing noted Skin: warm, dry Neuro: CN2-12 grossly intact. Strength 5/5 in upper and lower extremities. Reflexes symmetric and intact bilaterally.  Psych: Normal affect and thought content     Keoshia Steinmetz M. Jimmey Ralph, MD 05/11/2020 9:14 AM

## 2020-05-11 NOTE — Assessment & Plan Note (Signed)
Patient is not sure if allopurinol has been helping to break kidney stones or not.  He has 1 or 2/year.  We will start taking allopurinol daily to see if this helps.  He does not notice any significant benefit with allopurinol but will stop.

## 2020-05-11 NOTE — Assessment & Plan Note (Signed)
Patient with no known gout flares.  We will check uric acid level today.  He is on allopurinol 300 mg daily.

## 2020-05-11 NOTE — Assessment & Plan Note (Signed)
Slightly above goal though at goal.  Continue losartan-HCTZ 50-12.5 daily.  Check labs today.

## 2020-05-13 NOTE — Progress Notes (Signed)
Please inform patient of the following:  Labs are all STABLE. Do not need to make any changes to his treatment plan at this time. Would like for him to continue working on diet and exercise and we can recheck in a year.  Katina Degree. Jimmey Ralph, MD 05/13/2020 9:03 AM

## 2020-06-16 DIAGNOSIS — D3131 Benign neoplasm of right choroid: Secondary | ICD-10-CM | POA: Diagnosis not present

## 2020-06-20 ENCOUNTER — Other Ambulatory Visit: Payer: Self-pay | Admitting: Family Medicine

## 2020-07-22 DIAGNOSIS — M542 Cervicalgia: Secondary | ICD-10-CM | POA: Diagnosis not present

## 2020-08-06 DIAGNOSIS — M542 Cervicalgia: Secondary | ICD-10-CM | POA: Diagnosis not present

## 2020-08-10 DIAGNOSIS — M9901 Segmental and somatic dysfunction of cervical region: Secondary | ICD-10-CM | POA: Diagnosis not present

## 2020-08-10 DIAGNOSIS — M9902 Segmental and somatic dysfunction of thoracic region: Secondary | ICD-10-CM | POA: Diagnosis not present

## 2020-08-10 DIAGNOSIS — M5384 Other specified dorsopathies, thoracic region: Secondary | ICD-10-CM | POA: Diagnosis not present

## 2020-08-10 DIAGNOSIS — M50122 Cervical disc disorder at C5-C6 level with radiculopathy: Secondary | ICD-10-CM | POA: Diagnosis not present

## 2020-08-11 DIAGNOSIS — M5384 Other specified dorsopathies, thoracic region: Secondary | ICD-10-CM | POA: Diagnosis not present

## 2020-08-11 DIAGNOSIS — M9901 Segmental and somatic dysfunction of cervical region: Secondary | ICD-10-CM | POA: Diagnosis not present

## 2020-08-11 DIAGNOSIS — M9902 Segmental and somatic dysfunction of thoracic region: Secondary | ICD-10-CM | POA: Diagnosis not present

## 2020-08-11 DIAGNOSIS — M50122 Cervical disc disorder at C5-C6 level with radiculopathy: Secondary | ICD-10-CM | POA: Diagnosis not present

## 2020-08-12 DIAGNOSIS — M50122 Cervical disc disorder at C5-C6 level with radiculopathy: Secondary | ICD-10-CM | POA: Diagnosis not present

## 2020-08-12 DIAGNOSIS — M5386 Other specified dorsopathies, lumbar region: Secondary | ICD-10-CM | POA: Diagnosis not present

## 2020-08-12 DIAGNOSIS — M9901 Segmental and somatic dysfunction of cervical region: Secondary | ICD-10-CM | POA: Diagnosis not present

## 2020-08-12 DIAGNOSIS — M9903 Segmental and somatic dysfunction of lumbar region: Secondary | ICD-10-CM | POA: Diagnosis not present

## 2020-08-13 DIAGNOSIS — M9901 Segmental and somatic dysfunction of cervical region: Secondary | ICD-10-CM | POA: Diagnosis not present

## 2020-08-13 DIAGNOSIS — M9903 Segmental and somatic dysfunction of lumbar region: Secondary | ICD-10-CM | POA: Diagnosis not present

## 2020-08-13 DIAGNOSIS — M5386 Other specified dorsopathies, lumbar region: Secondary | ICD-10-CM | POA: Diagnosis not present

## 2020-08-13 DIAGNOSIS — M50122 Cervical disc disorder at C5-C6 level with radiculopathy: Secondary | ICD-10-CM | POA: Diagnosis not present

## 2020-08-24 DIAGNOSIS — M5386 Other specified dorsopathies, lumbar region: Secondary | ICD-10-CM | POA: Diagnosis not present

## 2020-08-24 DIAGNOSIS — M9903 Segmental and somatic dysfunction of lumbar region: Secondary | ICD-10-CM | POA: Diagnosis not present

## 2020-08-24 DIAGNOSIS — M9901 Segmental and somatic dysfunction of cervical region: Secondary | ICD-10-CM | POA: Diagnosis not present

## 2020-08-24 DIAGNOSIS — M50122 Cervical disc disorder at C5-C6 level with radiculopathy: Secondary | ICD-10-CM | POA: Diagnosis not present

## 2020-08-25 DIAGNOSIS — M9901 Segmental and somatic dysfunction of cervical region: Secondary | ICD-10-CM | POA: Diagnosis not present

## 2020-08-25 DIAGNOSIS — M5386 Other specified dorsopathies, lumbar region: Secondary | ICD-10-CM | POA: Diagnosis not present

## 2020-08-25 DIAGNOSIS — M9903 Segmental and somatic dysfunction of lumbar region: Secondary | ICD-10-CM | POA: Diagnosis not present

## 2020-08-25 DIAGNOSIS — M50122 Cervical disc disorder at C5-C6 level with radiculopathy: Secondary | ICD-10-CM | POA: Diagnosis not present

## 2020-09-07 DIAGNOSIS — M9903 Segmental and somatic dysfunction of lumbar region: Secondary | ICD-10-CM | POA: Diagnosis not present

## 2020-09-07 DIAGNOSIS — M9901 Segmental and somatic dysfunction of cervical region: Secondary | ICD-10-CM | POA: Diagnosis not present

## 2020-09-07 DIAGNOSIS — M5386 Other specified dorsopathies, lumbar region: Secondary | ICD-10-CM | POA: Diagnosis not present

## 2020-09-07 DIAGNOSIS — M50122 Cervical disc disorder at C5-C6 level with radiculopathy: Secondary | ICD-10-CM | POA: Diagnosis not present

## 2020-09-10 DIAGNOSIS — M9901 Segmental and somatic dysfunction of cervical region: Secondary | ICD-10-CM | POA: Diagnosis not present

## 2020-09-10 DIAGNOSIS — M50122 Cervical disc disorder at C5-C6 level with radiculopathy: Secondary | ICD-10-CM | POA: Diagnosis not present

## 2020-09-10 DIAGNOSIS — M5386 Other specified dorsopathies, lumbar region: Secondary | ICD-10-CM | POA: Diagnosis not present

## 2020-09-10 DIAGNOSIS — M9903 Segmental and somatic dysfunction of lumbar region: Secondary | ICD-10-CM | POA: Diagnosis not present

## 2020-09-14 DIAGNOSIS — M9901 Segmental and somatic dysfunction of cervical region: Secondary | ICD-10-CM | POA: Diagnosis not present

## 2020-09-14 DIAGNOSIS — M5386 Other specified dorsopathies, lumbar region: Secondary | ICD-10-CM | POA: Diagnosis not present

## 2020-09-14 DIAGNOSIS — M50122 Cervical disc disorder at C5-C6 level with radiculopathy: Secondary | ICD-10-CM | POA: Diagnosis not present

## 2020-09-14 DIAGNOSIS — M9903 Segmental and somatic dysfunction of lumbar region: Secondary | ICD-10-CM | POA: Diagnosis not present

## 2020-09-18 ENCOUNTER — Other Ambulatory Visit: Payer: Self-pay | Admitting: Family Medicine

## 2020-09-22 DIAGNOSIS — M9903 Segmental and somatic dysfunction of lumbar region: Secondary | ICD-10-CM | POA: Diagnosis not present

## 2020-09-22 DIAGNOSIS — M50122 Cervical disc disorder at C5-C6 level with radiculopathy: Secondary | ICD-10-CM | POA: Diagnosis not present

## 2020-09-22 DIAGNOSIS — M9901 Segmental and somatic dysfunction of cervical region: Secondary | ICD-10-CM | POA: Diagnosis not present

## 2020-09-22 DIAGNOSIS — M5386 Other specified dorsopathies, lumbar region: Secondary | ICD-10-CM | POA: Diagnosis not present

## 2020-09-23 DIAGNOSIS — M9903 Segmental and somatic dysfunction of lumbar region: Secondary | ICD-10-CM | POA: Diagnosis not present

## 2020-09-23 DIAGNOSIS — M9901 Segmental and somatic dysfunction of cervical region: Secondary | ICD-10-CM | POA: Diagnosis not present

## 2020-09-23 DIAGNOSIS — M5386 Other specified dorsopathies, lumbar region: Secondary | ICD-10-CM | POA: Diagnosis not present

## 2020-09-23 DIAGNOSIS — M50122 Cervical disc disorder at C5-C6 level with radiculopathy: Secondary | ICD-10-CM | POA: Diagnosis not present

## 2020-09-28 DIAGNOSIS — M9901 Segmental and somatic dysfunction of cervical region: Secondary | ICD-10-CM | POA: Diagnosis not present

## 2020-09-28 DIAGNOSIS — M5386 Other specified dorsopathies, lumbar region: Secondary | ICD-10-CM | POA: Diagnosis not present

## 2020-09-28 DIAGNOSIS — M50122 Cervical disc disorder at C5-C6 level with radiculopathy: Secondary | ICD-10-CM | POA: Diagnosis not present

## 2020-09-28 DIAGNOSIS — M9903 Segmental and somatic dysfunction of lumbar region: Secondary | ICD-10-CM | POA: Diagnosis not present

## 2020-10-01 DIAGNOSIS — M50122 Cervical disc disorder at C5-C6 level with radiculopathy: Secondary | ICD-10-CM | POA: Diagnosis not present

## 2020-10-01 DIAGNOSIS — M5386 Other specified dorsopathies, lumbar region: Secondary | ICD-10-CM | POA: Diagnosis not present

## 2020-10-01 DIAGNOSIS — M9901 Segmental and somatic dysfunction of cervical region: Secondary | ICD-10-CM | POA: Diagnosis not present

## 2020-10-01 DIAGNOSIS — M9903 Segmental and somatic dysfunction of lumbar region: Secondary | ICD-10-CM | POA: Diagnosis not present

## 2020-10-06 DIAGNOSIS — M9901 Segmental and somatic dysfunction of cervical region: Secondary | ICD-10-CM | POA: Diagnosis not present

## 2020-10-06 DIAGNOSIS — M50122 Cervical disc disorder at C5-C6 level with radiculopathy: Secondary | ICD-10-CM | POA: Diagnosis not present

## 2020-10-06 DIAGNOSIS — M5386 Other specified dorsopathies, lumbar region: Secondary | ICD-10-CM | POA: Diagnosis not present

## 2020-10-06 DIAGNOSIS — M9903 Segmental and somatic dysfunction of lumbar region: Secondary | ICD-10-CM | POA: Diagnosis not present

## 2020-10-13 DIAGNOSIS — M9901 Segmental and somatic dysfunction of cervical region: Secondary | ICD-10-CM | POA: Diagnosis not present

## 2020-10-13 DIAGNOSIS — M9903 Segmental and somatic dysfunction of lumbar region: Secondary | ICD-10-CM | POA: Diagnosis not present

## 2020-10-13 DIAGNOSIS — M50122 Cervical disc disorder at C5-C6 level with radiculopathy: Secondary | ICD-10-CM | POA: Diagnosis not present

## 2020-10-13 DIAGNOSIS — M5386 Other specified dorsopathies, lumbar region: Secondary | ICD-10-CM | POA: Diagnosis not present

## 2020-12-08 DIAGNOSIS — J069 Acute upper respiratory infection, unspecified: Secondary | ICD-10-CM | POA: Diagnosis not present

## 2021-01-04 DIAGNOSIS — R6889 Other general symptoms and signs: Secondary | ICD-10-CM | POA: Diagnosis not present

## 2021-01-04 DIAGNOSIS — R059 Cough, unspecified: Secondary | ICD-10-CM | POA: Diagnosis not present

## 2021-01-04 DIAGNOSIS — J09X2 Influenza due to identified novel influenza A virus with other respiratory manifestations: Secondary | ICD-10-CM | POA: Diagnosis not present

## 2021-03-17 ENCOUNTER — Other Ambulatory Visit: Payer: Self-pay | Admitting: Family Medicine

## 2021-04-13 IMAGING — CT CT MAXILLOFACIAL WITHOUT CONTRAST
3 of 6 series · 15 of 47 positions shown, 18 images · non-contrast
Comparison: None.

CLINICAL DATA: Minor head trauma with forehead facial swelling.

EXAM:
CT HEAD WITHOUT CONTRAST
CT MAXILLOFACIAL WITHOUT CONTRAST
TECHNIQUE: Multidetector CT imaging of the head and maxillofacial structures
were performed using the standard protocol without intravenous
contrast. Multiplanar CT image reconstructions of the maxillofacial
structures were also generated.

[Series 5: head 3.0 mpr cor · coronal · 0.34mm/px · 3 of 75 slices shown]
[im 13/75  bone]
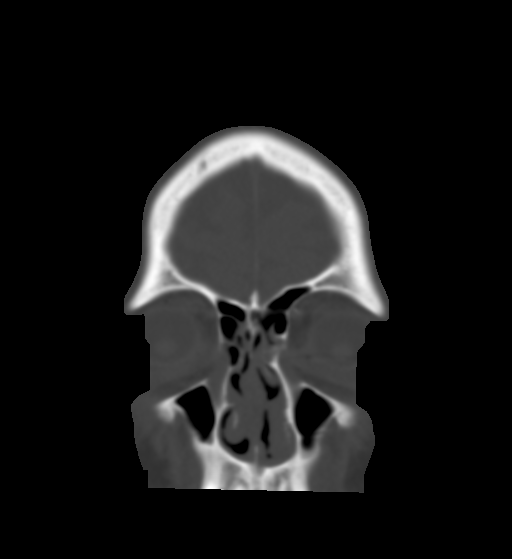
[im 25/75  bone]
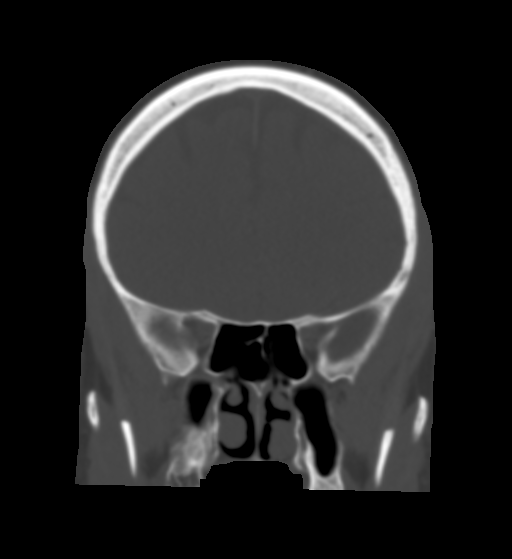
[im 37/75  bone]
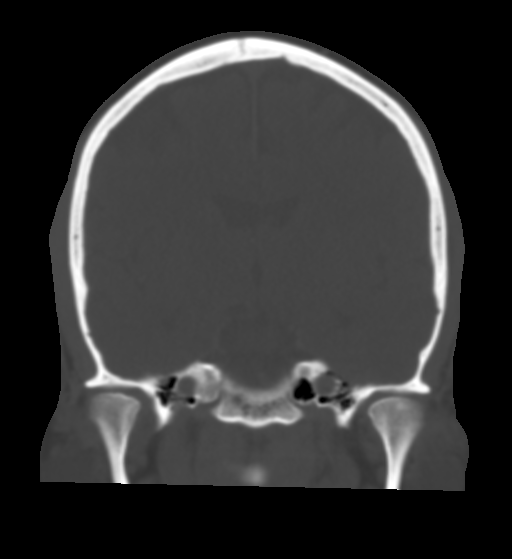

[Series 6: head 3.0 mpr sag · sagittal · 0.34mm/px · 1 of 61 slices shown]
[im 31/61  bone]
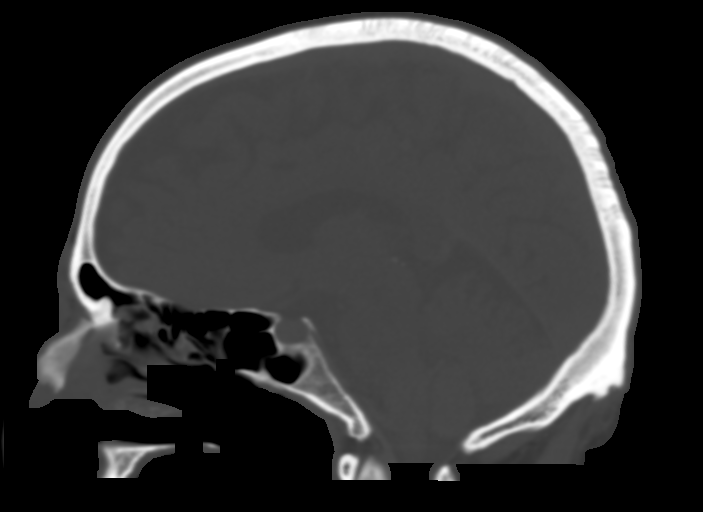

[Series 7: facial/ orbits 2.0 h30s · axial · 0.37mm/px · z∈[-230,-78]mm · 11 of 86 slices shown, 14 images]
[im 5/86  brain]
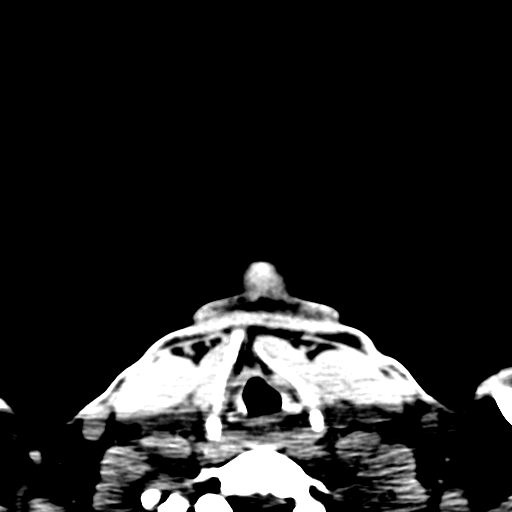
[im 5/86  bone]
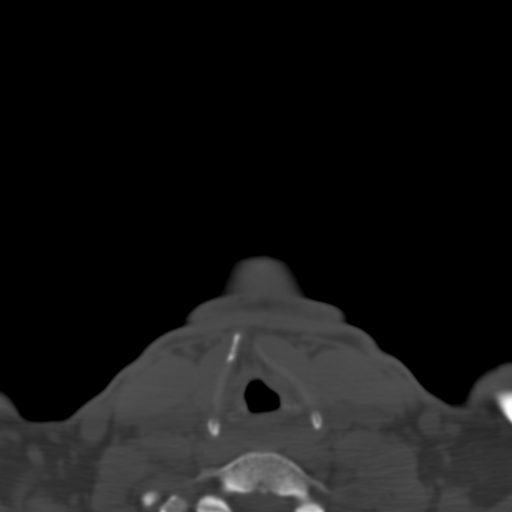
[im 13/86  bone]
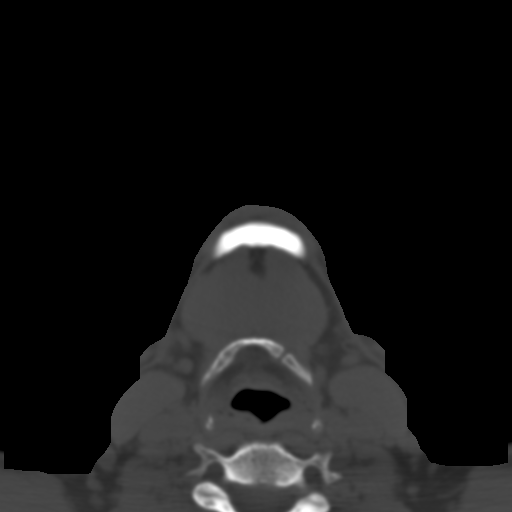
[im 22/86  bone]
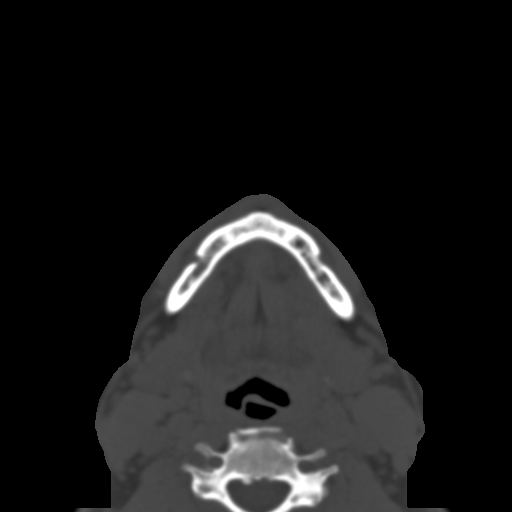
[im 30/86  bone]
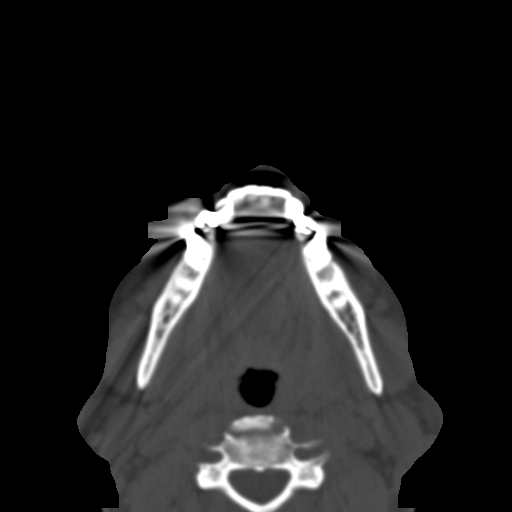
[im 35/86  brain]
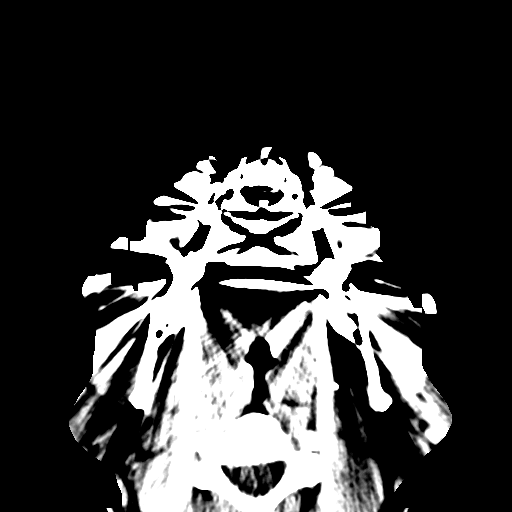
[im 35/86  bone]
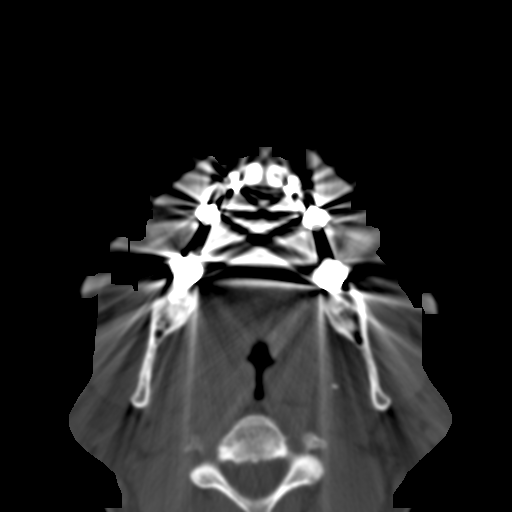
[im 43/86  bone]
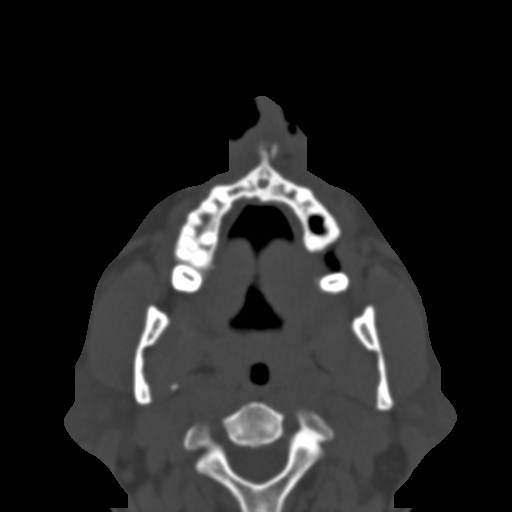
[im 52/86  bone]
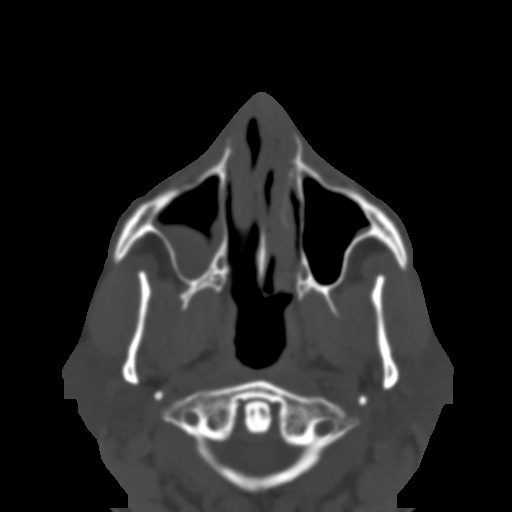
[im 56/86  bone]
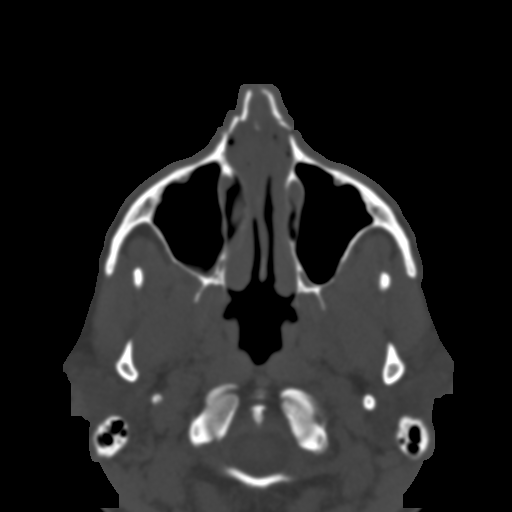
[im 64/86  brain]
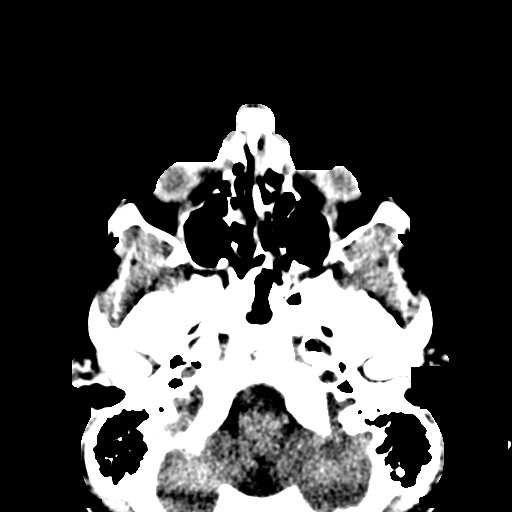
[im 64/86  bone]
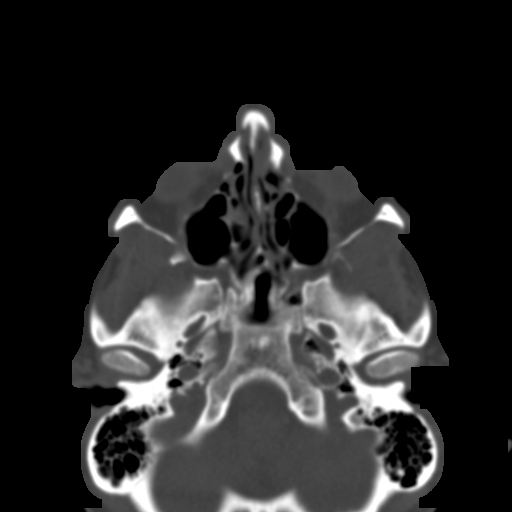
[im 73/86  bone]
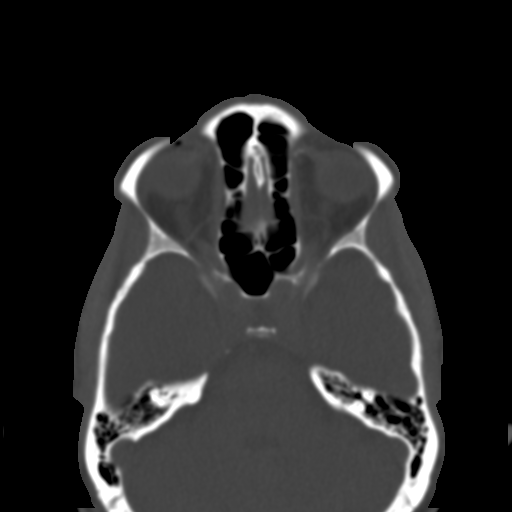
[im 81/86  bone]
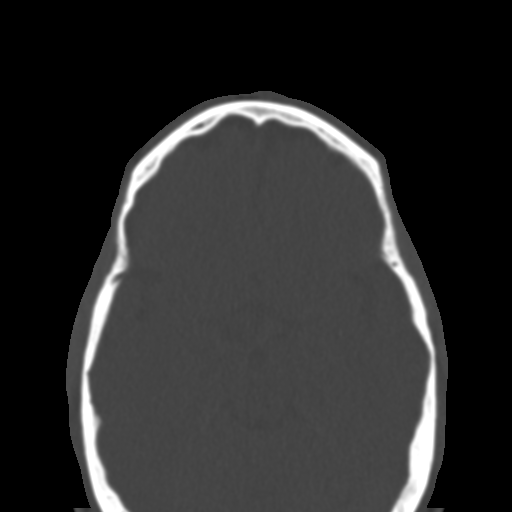

[15 of 47 positions shown; findings below may reference images not displayed]

FINDINGS: CT HEAD FINDINGS

Brain: No evidence of acute infarction, hemorrhage, hydrocephalus,
extra-axial collection or mass lesion/mass effect. Small remote left
superior cerebellar infarct

Vascular: No hyperdense vessel or unexpected calcification.

Skull: Central forehead hematoma, reference below.

CT MAXILLOFACIAL FINDINGS

Osseous: Comminuted bilateral nasal arch fracture with displacement
on both sides. There is involvement of the anterior cartilaginous
septum where there is soft tissue emphysema just above a nasal spine
fracture. The nasal septum is deviated to the left. No orbital or
ethmoid continuation.

The mandible is intact and located.

Orbits: No evidence of postseptal injury.

Sinuses: Chronic/inflammatory appearing opacification of the
inferior right maxillary sinus with central mineralization.

Soft tissues: Swelling over the nasal bridge, upper lip, and central
forehead. No opaque foreign body
IMPRESSION: 1. Comminuted bilateral nasal arch with displacement on both sides.
There is also fracturing of the nasal septum and nasal spine with
leftward septal deviation narrowing the left nasal cavity.
2. No evidence of acute intracranial injury.

## 2021-06-29 ENCOUNTER — Telehealth: Payer: Self-pay | Admitting: Family Medicine

## 2021-06-29 NOTE — Telephone Encounter (Signed)
..   Encourage patient to contact the pharmacy for refills or they can request refills through [facility]  LAST APPOINTMENT DATE:  05/11/2020  NEXT APPOINTMENT DATE: 10/04/21  MEDICATION: losartan-hydrochlorothiazide (HYZAAR) 50-12.5 MG tablet  Is the patient out of medication? Has 5 doses left  PHARMACY: [id number] [place], [place] - [address] AT [facility] [address] [facility] [address]  [address], [place] [address]  Phone:  [phone]  Fax:  [phone]  DEA #:  [id number]  Let patient know to contact pharmacy at the end of the day to make sure medication is ready.  Please notify patient to allow 48-72 hours to process   Patient was told his prescription was denied when he tried to pick it up recently.

## 2021-06-29 NOTE — Telephone Encounter (Signed)
Patient need OV for refills.  

## 2021-06-29 NOTE — Telephone Encounter (Signed)
Can this OV be with another provider due to patient only having 5 doses left of medication?

## 2021-06-30 ENCOUNTER — Other Ambulatory Visit: Payer: Self-pay | Admitting: *Deleted

## 2021-06-30 MED ORDER — LOSARTAN POTASSIUM-HCTZ 50-12.5 MG PO TABS: 1.0000 | ORAL_TABLET | Freq: Every day | ORAL | 0 refills | Status: DC

## 2021-06-30 NOTE — Telephone Encounter (Signed)
Rx send to pharmacy  

## 2021-06-30 NOTE — Telephone Encounter (Signed)
Called on 06/14 for scheduling but due to no answer I left a message.

## 2021-06-30 NOTE — Telephone Encounter (Signed)
Patient called back and I was able to schedule him on 08/09/21 at 10:40 am. Are you able to refill his medication to last him until this appointment?

## 2021-08-09 ENCOUNTER — Ambulatory Visit (INDEPENDENT_AMBULATORY_CARE_PROVIDER_SITE_OTHER): Payer: BC Managed Care – PPO | Admitting: Family Medicine

## 2021-08-09 ENCOUNTER — Encounter: Payer: Self-pay | Admitting: Family Medicine

## 2021-08-09 VITALS — BP 140/98 | HR 76 | Temp 98.3°F | Ht 65.5 in | Wt 149.0 lb

## 2021-08-09 DIAGNOSIS — N200 Calculus of kidney: Secondary | ICD-10-CM | POA: Diagnosis not present

## 2021-08-09 DIAGNOSIS — I10 Essential (primary) hypertension: Secondary | ICD-10-CM | POA: Diagnosis not present

## 2021-08-09 DIAGNOSIS — J309 Allergic rhinitis, unspecified: Secondary | ICD-10-CM | POA: Diagnosis not present

## 2021-08-09 DIAGNOSIS — M109 Gout, unspecified: Secondary | ICD-10-CM

## 2021-08-09 DIAGNOSIS — Z23 Encounter for immunization: Secondary | ICD-10-CM | POA: Diagnosis not present

## 2021-08-09 DIAGNOSIS — Z1211 Encounter for screening for malignant neoplasm of colon: Secondary | ICD-10-CM

## 2021-08-09 MED ORDER — ALLOPURINOL 300 MG PO TABS
300.0000 mg | ORAL_TABLET | Freq: Every day | ORAL | 3 refills | Status: AC
Start: 2021-08-09 — End: ?

## 2021-08-09 MED ORDER — LOSARTAN POTASSIUM-HCTZ 50-12.5 MG PO TABS: 1.0000 | ORAL_TABLET | Freq: Every day | ORAL | 3 refills | Status: DC

## 2021-08-09 NOTE — Assessment & Plan Note (Signed)
Stable on Allegra 180 mg daily.

## 2021-08-09 NOTE — Assessment & Plan Note (Signed)
Stable. No recent flares.  

## 2021-08-09 NOTE — Assessment & Plan Note (Signed)
Recently had a stone that passed.  Recommend he take allopurinol daily.  They can follow-up with urology as needed.

## 2021-08-09 NOTE — Patient Instructions (Signed)
It was very nice to see you today!  We we will flush out your ears today.  I will refill your medications.  Please keep an eye on your blood pressure and let us know if it is persistently elevated.  We will refer you for colonoscopy.  I will see back a couple of months for your annual physical.  Please come back sooner if needed.  Take care, Dr Jimmey Ralph  PLEASE NOTE:  If you had any lab tests please let us know if you have not heard back within a few days. You may see your results on mychart before we have a chance to review them but we will give you a call once they are reviewed by [place]. If we ordered any referrals today, please let us know if you have not heard from their office within the next week.   Please try these tips to maintain a healthy lifestyle:  Eat at least 3 REAL meals and 1-2 snacks per day.  Aim for no more than 5 hours between eating.  If you eat breakfast, please do so within one hour of getting up.   Each meal should contain half fruits/vegetables, one quarter protein, and one quarter carbs (no bigger than a computer mouse)  Cut down on sweet beverages. This includes juice, soda, and sweet tea.   Drink at least 1 glass of water with each meal and aim for at least 8 glasses per day  Exercise at least 150 minutes every week.

## 2021-08-09 NOTE — Assessment & Plan Note (Signed)
Slightly above goal.  We will continue current regimen losartan-HCTZ 50-12.5 daily.  He will continue to monitor at home.  He will let us know if persistently elevated at home.  If it is still elevated at his next office visit we will likely need to increase dose.

## 2021-08-09 NOTE — Progress Notes (Signed)
   Emric Kowalewski is a 61 y.o. male who presents today for an office visit.  Assessment/Plan:  New/Acute Problems: Cerumen impaction Successfully irrigated by CMA today.  You can use over-the-counter hydrogen peroxide in the future to prevent buildup.  Chronic Problems Addressed Today: History of Urate kidney stone Recently had a stone that passed.  Recommend he take allopurinol daily.  They can follow-up with urology as needed.  Allergic rhinitis Stable on Allegra 180 mg daily.  Essential hypertension Slightly above goal.  We will continue current regimen losartan-HCTZ 50-12.5 daily.  He will continue to monitor at home.  He will let us know if persistently elevated at home.  If it is still elevated at his next office visit we will likely need to increase dose.  Gout Stable.  No recent flares.   Preventative health care Shingles vaccine given today.  Will refer for colonoscopy.  He will come back soon for CPE.    Subjective:  HPI:  See A/P for status of chronic conditions.  He feels like his left ear drum is clogged.  This has been going on for a few days.  No pain.      Objective:  Physical Exam: BP (!) 140/98   Pulse 76   Temp 98.3 F (36.8 C) (Temporal)   Ht 5' 5.5" (1.664 m)   Wt 149 lb (67.6 kg)   SpO2 96%   BMI 24.42 kg/m   Gen: No acute distress, resting comfortably HEENT: Bilateral EACs with cerumen impaction. CV: Regular rate and rhythm with no murmurs appreciated Pulm: Normal work of breathing, clear to auscultation bilaterally with no crackles, wheezes, or rhonchi Neuro: Grossly normal, moves all extremities Psych: Normal affect and thought content      Brihana Quickel M. Jimmey Ralph, MD 08/09/2021 10:56 AM

## 2021-08-09 NOTE — Addendum Note (Signed)
Addended by: Dyann Kief on: 08/09/2021 11:09 AM   Modules accepted: Orders

## 2021-09-16 DIAGNOSIS — N281 Cyst of kidney, acquired: Secondary | ICD-10-CM | POA: Diagnosis not present

## 2021-09-16 DIAGNOSIS — N400 Enlarged prostate without lower urinary tract symptoms: Secondary | ICD-10-CM | POA: Diagnosis not present

## 2021-09-16 DIAGNOSIS — R3121 Asymptomatic microscopic hematuria: Secondary | ICD-10-CM | POA: Diagnosis not present

## 2021-09-16 DIAGNOSIS — N200 Calculus of kidney: Secondary | ICD-10-CM | POA: Diagnosis not present

## 2021-09-22 DIAGNOSIS — R3121 Asymptomatic microscopic hematuria: Secondary | ICD-10-CM | POA: Diagnosis not present

## 2021-10-04 ENCOUNTER — Encounter: Payer: BC Managed Care – PPO | Admitting: Family Medicine

## 2021-10-08 DIAGNOSIS — R3121 Asymptomatic microscopic hematuria: Secondary | ICD-10-CM | POA: Diagnosis not present

## 2021-10-08 DIAGNOSIS — N200 Calculus of kidney: Secondary | ICD-10-CM | POA: Diagnosis not present

## 2021-10-11 ENCOUNTER — Encounter: Payer: Self-pay | Admitting: *Deleted

## 2021-10-14 ENCOUNTER — Other Ambulatory Visit: Payer: Self-pay | Admitting: Urology

## 2021-10-21 ENCOUNTER — Encounter (HOSPITAL_BASED_OUTPATIENT_CLINIC_OR_DEPARTMENT_OTHER): Payer: Self-pay | Admitting: Urology

## 2021-10-21 NOTE — Progress Notes (Signed)
Left voicemail to return call. 

## 2021-10-21 NOTE — Progress Notes (Signed)
Pre-op phone call complete. Procedure date and arrival time confirmed. Allergies, medications, and medical history verified. Patient advised not to have Aspirin 72 hours prior to procedure. Driver secured.

## 2021-10-25 ENCOUNTER — Encounter (HOSPITAL_BASED_OUTPATIENT_CLINIC_OR_DEPARTMENT_OTHER): Payer: Self-pay | Admitting: Urology

## 2021-10-25 ENCOUNTER — Encounter (HOSPITAL_BASED_OUTPATIENT_CLINIC_OR_DEPARTMENT_OTHER)
Admission: RE | Disposition: A | Discharge status: Home / Self Care | Payer: Self-pay | Source: Ambulatory Visit | Attending: Urology

## 2021-10-25 ENCOUNTER — Ambulatory Visit (HOSPITAL_COMMUNITY): Payer: BC Managed Care – PPO

## 2021-10-25 ENCOUNTER — Ambulatory Visit (HOSPITAL_BASED_OUTPATIENT_CLINIC_OR_DEPARTMENT_OTHER)
Admission: RE | Admit: 2021-10-25 | Discharge: 2021-10-25 | Disposition: A | Discharge status: Home / Self Care | Payer: BC Managed Care – PPO | Source: Ambulatory Visit | Attending: Urology | Admitting: Urology

## 2021-10-25 DIAGNOSIS — N2889 Other specified disorders of kidney and ureter: Secondary | ICD-10-CM | POA: Diagnosis not present

## 2021-10-25 DIAGNOSIS — N200 Calculus of kidney: Secondary | ICD-10-CM | POA: Insufficient documentation

## 2021-10-25 DIAGNOSIS — M419 Scoliosis, unspecified: Secondary | ICD-10-CM | POA: Diagnosis not present

## 2021-10-25 DIAGNOSIS — Z87442 Personal history of urinary calculi: Secondary | ICD-10-CM | POA: Diagnosis not present

## 2021-10-25 HISTORY — PX: EXTRACORPOREAL SHOCK WAVE LITHOTRIPSY: SHX1557

## 2021-10-25 SURGERY — LITHOTRIPSY, ESWL
Anesthesia: LOCAL | Laterality: Left

## 2021-10-25 MED ORDER — SODIUM CHLORIDE 0.9 % IV SOLN: INTRAVENOUS | Status: DC

## 2021-10-25 MED ORDER — CIPROFLOXACIN HCL 500 MG PO TABS
ORAL_TABLET | ORAL | Status: AC
  Filled 2021-10-25: qty 1

## 2021-10-25 MED ORDER — DIPHENHYDRAMINE HCL 25 MG PO CAPS
ORAL_CAPSULE | ORAL | Status: AC
  Filled 2021-10-25: qty 1

## 2021-10-25 MED ORDER — CIPROFLOXACIN HCL 500 MG PO TABS
500.0000 mg | ORAL_TABLET | ORAL | Status: AC
  Administered 2021-10-25: 500 mg via ORAL

## 2021-10-25 MED ORDER — DIAZEPAM 5 MG PO TABS
10.0000 mg | ORAL_TABLET | ORAL | Status: AC
  Administered 2021-10-25: 10 mg via ORAL

## 2021-10-25 MED ORDER — DIPHENHYDRAMINE HCL 25 MG PO CAPS
25.0000 mg | ORAL_CAPSULE | ORAL | Status: AC
  Administered 2021-10-25: 25 mg via ORAL

## 2021-10-25 MED ORDER — OXYCODONE HCL 5 MG PO TABS: 5.0000 mg | ORAL_TABLET | ORAL | 0 refills | Status: AC | PRN

## 2021-10-25 MED ORDER — DIAZEPAM 5 MG PO TABS
ORAL_TABLET | ORAL | Status: AC
  Filled 2021-10-25: qty 2

## 2021-10-25 NOTE — Discharge Instructions (Signed)
See [facility] discharge instructions in chart.  

## 2021-10-25 NOTE — Op Note (Signed)
See [facility] discharge instructions in chart.  

## 2021-10-25 NOTE — H&P (Signed)
H&P  Chief Complaint: Lt kidney stone  History of Present Illness: 61 yo male presents for ESl of a 10 mm Lt interpolar stone.  Past Medical History:  Diagnosis Date   ALLERGIC RHINITIS 09/11/2006   Qualifier: Diagnosis of  By: Sherren Mocha RN, Dorian Pod     ATTENTION DEFICIT DISORDER, ADULT 12/09/2008   Qualifier: Diagnosis of  By: Sherren Mocha MD, Jory Ee    Carbuncle and furuncle of buttock 11/20/2006   Qualifier: Diagnosis of  By: Sherren Mocha MD, Jory Ee    DYSPEPSIA 03/21/2008   Qualifier: Diagnosis of  By: Burnice Logan  MD, Doretha Sou    Extrinsic asthma, unspecified 01/10/2008   Qualifier: Diagnosis of  By: Sherren Mocha MD, Jory Ee    GOUT 09/11/2006   Qualifier: Diagnosis of  By: Scherrie Gerlach     History of kidney stones    HYPERTENSION 09/11/2006   Qualifier: Diagnosis of  By: Sherren Mocha RN, Marin Shutter, HX OF 01/31/2008   Qualifier: Diagnosis of  By: Sherren Mocha MD, Jory Ee    PALPITATIONS, HX OF 03/21/2008   Qualifier: Diagnosis of  By: Burnice Logan  MD, Doretha Sou    PILONIDAL CYST 01/31/2008   Qualifier: Diagnosis of  By: Sherren Mocha MD, Jory Ee    SEBACEOUS CYST, NECK 09/28/2006   Qualifier: Diagnosis of  By: Sherren Mocha MD, Jory Ee    VIRAL URI 01/05/2008   Qualifier: Diagnosis of  By: Sherren Mocha MD, Jory Ee     Past Surgical History:  Procedure Laterality Date   CLOSED REDUCTION NASAL FRACTURE Bilateral 07/25/2018   Procedure: CLOSED REDUCTION NASAL FRACTURE;  Surgeon: Melida Quitter, MD;  Location: [facility] OR;  Service: ENT;  Laterality: Bilateral;   deviated septum repair     nephrolithiasis     QUADRICEPS TENDON REPAIR     Partial meniscectomy    Home Medications:    Allergies: No Known Allergies  Family History  Problem Relation Age of Onset   Hypertension Other     Social History:  reports that he has never smoked. He has never used smokeless tobacco. He reports current alcohol use. He reports that he does not use drugs.  ROS: A complete review of systems was performed.  All systems are negative  except for pertinent findings as noted.  Physical Exam:  Vital signs in last 24 hours: BP (!) 153/101   Pulse 73   Temp 97.8 F (36.6 C) (Oral)   Resp 17   Ht 5' 5.5" (1.664 m)   Wt 65.9 kg   SpO2 99%   BMI 23.80 kg/m  Constitutional:  Alert and oriented, No acute distress Cardiovascular: Regular rate  Respiratory: Normal respiratory effort  Neurologic: Grossly intact, no focal deficits Psychiatric: Normal mood and affect  I have reviewed prior pt notes  I have reviewed notes from referring/previous physicians  I have reviewed urinalysis results  I have independently reviewed prior imaging    Impression/Assessment:  Lt renal stone  Plan:  ESL. This is the first part of a poossible staged procedure.

## 2021-10-25 NOTE — Interval H&P Note (Signed)
History and Physical Interval Note:  10/25/2021 8:47 AM  Zachary Gutierrez  has presented today for surgery, with the diagnosis of LEFT RENAL STONE.  The various methods of treatment have been discussed with the patient and family. After consideration of risks, benefits and other options for treatment, the patient has consented to  Procedure(s): LEFT EXTRACORPOREAL SHOCK WAVE LITHOTRIPSY (ESWL) (Left) as a surgical intervention.  The patient's history has been reviewed, patient examined, no change in status, stable for surgery.  I have reviewed the patient's chart and labs.  Questions were answered to the patient's satisfaction.     Lillette Boxer Shakeeta Godette

## 2021-10-26 ENCOUNTER — Encounter (HOSPITAL_BASED_OUTPATIENT_CLINIC_OR_DEPARTMENT_OTHER): Payer: Self-pay | Admitting: Urology

## 2021-10-28 ENCOUNTER — Encounter: Payer: BC Managed Care – PPO | Admitting: Family Medicine

## 2021-11-09 DIAGNOSIS — N200 Calculus of kidney: Secondary | ICD-10-CM | POA: Diagnosis not present

## 2021-11-24 ENCOUNTER — Encounter: Payer: BC Managed Care – PPO | Admitting: Family Medicine

## 2021-12-30 ENCOUNTER — Encounter: Payer: Self-pay | Admitting: *Deleted

## 2022-05-14 ENCOUNTER — Other Ambulatory Visit: Payer: Self-pay | Admitting: Family Medicine

## 2022-06-28 DIAGNOSIS — H16223 Keratoconjunctivitis sicca, not specified as Sjogren's, bilateral: Secondary | ICD-10-CM | POA: Diagnosis not present

## 2022-09-07 ENCOUNTER — Other Ambulatory Visit: Payer: Self-pay | Admitting: Family Medicine

## 2022-10-11 DIAGNOSIS — S0501XA Injury of conjunctiva and corneal abrasion without foreign body, right eye, initial encounter: Secondary | ICD-10-CM | POA: Diagnosis not present

## 2022-10-13 ENCOUNTER — Ambulatory Visit: Payer: BC Managed Care – PPO | Admitting: Family Medicine

## 2022-10-13 ENCOUNTER — Encounter: Payer: Self-pay | Admitting: Family Medicine

## 2022-10-13 VITALS — BP 132/85 | HR 82 | Temp 97.5°F | Ht 65.5 in | Wt 144.0 lb

## 2022-10-13 DIAGNOSIS — R197 Diarrhea, unspecified: Secondary | ICD-10-CM

## 2022-10-13 DIAGNOSIS — I10 Essential (primary) hypertension: Secondary | ICD-10-CM | POA: Diagnosis not present

## 2022-10-13 DIAGNOSIS — N200 Calculus of kidney: Secondary | ICD-10-CM | POA: Diagnosis not present

## 2022-10-13 NOTE — Patient Instructions (Addendum)
It was very nice to see you today!  We will check a stool sample.  We may need to get a CT scan and refer you to see the gastroenterologist depending on results.  Return if symptoms worsen or fail to improve.   Take care, Dr Jimmey Ralph  PLEASE NOTE:  If you had any lab tests, please let us know if you have not heard back within a few days. You may see your results on mychart before we have a chance to review them but we will give you a call once they are reviewed by [place].   If we ordered any referrals today, please let us know if you have not heard from their office within the next week.   If you had any urgent prescriptions sent in today, please check with the pharmacy within an hour of our visit to make sure the prescription was transmitted appropriately.   Please try these tips to maintain a healthy lifestyle:  Eat at least 3 REAL meals and 1-2 snacks per day.  Aim for no more than 5 hours between eating.  If you eat breakfast, please do so within one hour of getting up.   Each meal should contain half fruits/vegetables, one quarter protein, and one quarter carbs (no bigger than a computer mouse)  Cut down on sweet beverages. This includes juice, soda, and sweet tea.   Drink at least 1 glass of water with each meal and aim for at least 8 glasses per day  Exercise at least 150 minutes every week.

## 2022-10-13 NOTE — Assessment & Plan Note (Signed)
On allopurinol 300 mg daily for gout.  Tolerating well.  We can recheck uric acid level next time he comes in for Comprehensive Physical Exam (CPE) preventive care annual visit.

## 2022-10-13 NOTE — Progress Notes (Signed)
   Zachary Gutierrez is a 62 y.o. male who presents today for an office visit.  Assessment/Plan:  New/Acute Problems: Diarrhea Likely foodborne however given length of symptoms and bloody stools we will check stool sample to further evaluate for bacterial pathogen.  He appears well clinically and has stable vital signs.  No signs of severe illness.  Do not think he needs aggressive fluid resuscitation or empiric antibiotics at this point.  It is also possible this may be a diverticular flare given his mild left lower quadrant tenderness on exam however he has no prior history or diagnosis of diverticulosis.  We may consider CT scan if symptoms persist and a stool sample is negative.  If symptoms persist beyond this will need referral to GI.  We discussed reasons to return to care and seek emergent care.  Chronic Problems Addressed Today: Essential hypertension Blood pressure is at goal today.  Continue losartan-HCTZ 50-12.5 daily.   History of Urate kidney stone On allopurinol 300 mg daily for gout.  Tolerating well.  We can recheck uric acid level next time he comes in for Comprehensive Physical Exam (CPE) preventive care annual visit.    Preventative Healthcare He will come back soon for CPE    Subjective:  HPI:  See Assessment / plan for status of chronic conditions.  Patient is here with diarrhea for the last 2 weeks.  He has also recently noted bright red blood and brown blood mixed with his stools.  He is not having abdominal pain though does feel slight cramping in abdomen before bowel movements.  He tried changing diet and eating more bland foods without improvement in symptoms.  No fevers or chills.  No nausea or vomiting.  Tried Imodium which has helped.        Objective:  Physical Exam: BP 132/85   Pulse 82   Temp (!) 97.5 F (36.4 C) (Temporal)   Ht 5' 5.5" (1.664 m)   Wt 144 lb (65.3 kg)   SpO2 97%   BMI 23.60 kg/m   Gen: No acute distress, resting  comfortably CV: Regular rate and rhythm with no murmurs appreciated Pulm: Normal work of breathing, clear to auscultation bilaterally with no crackles, wheezes, or rhonchi Abdomen: Soft, nondistended.  Tenderness palpation left lower quadrant.  No rebound or guarding. Neuro: Grossly normal, moves all extremities Psych: Normal affect and thought content      Zachary Gutierrez M. Jimmey Ralph, MD 10/13/2022 10:33 AM

## 2022-10-13 NOTE — Assessment & Plan Note (Signed)
Blood pressure is at goal today.  Continue losartan-HCTZ 50-12.5 daily.

## 2022-10-14 ENCOUNTER — Other Ambulatory Visit: Payer: BC Managed Care – PPO

## 2022-10-14 ENCOUNTER — Telehealth: Payer: Self-pay | Admitting: Physician Assistant

## 2022-10-14 DIAGNOSIS — R197 Diarrhea, unspecified: Secondary | ICD-10-CM | POA: Diagnosis not present

## 2022-10-14 NOTE — Telephone Encounter (Signed)
Patient returned stool specimen to lab today requested by PCP.  Lab tech had concerns regarding stools appearance and brought to me to evaluate -- stool sample appears to resemble bright red liquidy blood without any stool.  Lupita Leash - please call patient and ensure symptoms have not worsened/changed since seeing provider yesterday and please encourage him to go to the ER if feeling lightheaded, weak, dizzy due to concern for amount of blood loss.  Lelon Mast

## 2022-10-14 NOTE — Telephone Encounter (Signed)
Spoke to pt told him calling about stool specimen that you dropped off. The lab tech brought it to [facility] attention since Dr. Jimmey Ralph is out of the office. We are concerned due to it is all blood. Pt said that was from 3 stools that I did. Asked pt if it is bloody like that all the time? He said no, off and on. Told him wanted ensure symptoms have not worsened/changed since seeing provider yesterday? Pt said no they are the same. Told him to go to the ER if feeling lightheaded, weak, dizzy due to concern for amount of blood loss. Pt said he is not having any symptoms he feels fine, but if he gets lightheaded I will go to the ER. Told him okay, will be in touch once results come back. Pt verbalized understanding.

## 2022-10-17 DIAGNOSIS — S0502XD Injury of conjunctiva and corneal abrasion without foreign body, left eye, subsequent encounter: Secondary | ICD-10-CM | POA: Diagnosis not present

## 2022-10-20 NOTE — Progress Notes (Signed)
Is still so far been negative.  No signs of C. difficile or any other infection.  We are still waiting on 1 test to come back-can we check with the biopsy but this will be done.  He should let us know if he is still having persistent symptoms and we can go forward with CT scan or referral to GI.

## 2022-10-21 ENCOUNTER — Telehealth: Payer: Self-pay | Admitting: Family Medicine

## 2022-10-21 LAB — C. DIFFICILE GDH AND TOXIN A/B
GDH ANTIGEN: NOT DETECTED
MICRO NUMBER:: 15525143
SPECIMEN QUALITY:: ADEQUATE
TOXIN A AND B: NOT DETECTED

## 2022-10-21 LAB — FECAL LEUKOCYTES
Micro Number: 15525831
Specimen Quality: ADEQUATE

## 2022-10-21 LAB — GASTROINTESTINAL PATHOGEN PNL
CampyloBacter Group: NOT DETECTED
Norovirus GI/GII: NOT DETECTED
Rotavirus A: NOT DETECTED
Salmonella species: NOT DETECTED
Shiga Toxin 1: NOT DETECTED
Shiga Toxin 2: NOT DETECTED
Shigella Species: NOT DETECTED
Vibrio Group: NOT DETECTED
Yersinia enterocolitica: NOT DETECTED

## 2022-10-21 NOTE — Progress Notes (Signed)
Stool sample shows that he has many leukocytes in his stool.  This is a sign of inflammation.  His infectious tests were all negative.  Recommend referral to GI if he is still having symptoms.

## 2022-10-21 NOTE — Telephone Encounter (Signed)
Patient returned Stella's call. Requests to be called back when able.

## 2022-10-24 DIAGNOSIS — S0502XD Injury of conjunctiva and corneal abrasion without foreign body, left eye, subsequent encounter: Secondary | ICD-10-CM | POA: Diagnosis not present

## 2022-10-25 ENCOUNTER — Other Ambulatory Visit: Payer: Self-pay | Admitting: *Deleted

## 2022-10-25 DIAGNOSIS — R197 Diarrhea, unspecified: Secondary | ICD-10-CM

## 2022-10-25 DIAGNOSIS — K921 Melena: Secondary | ICD-10-CM

## 2022-10-27 ENCOUNTER — Other Ambulatory Visit: Payer: Self-pay | Admitting: *Deleted

## 2022-10-27 DIAGNOSIS — K921 Melena: Secondary | ICD-10-CM

## 2022-10-27 DIAGNOSIS — S0502XD Injury of conjunctiva and corneal abrasion without foreign body, left eye, subsequent encounter: Secondary | ICD-10-CM | POA: Diagnosis not present

## 2022-10-27 NOTE — Telephone Encounter (Signed)
See results note. 

## 2022-10-29 DIAGNOSIS — H16233 Neurotrophic keratoconjunctivitis, bilateral: Secondary | ICD-10-CM | POA: Diagnosis not present

## 2022-11-01 ENCOUNTER — Encounter: Payer: Self-pay | Admitting: Family Medicine

## 2022-11-09 ENCOUNTER — Ambulatory Visit
Admission: RE | Admit: 2022-11-09 | Discharge: 2022-11-09 | Disposition: A | Discharge status: Home / Self Care | Payer: BC Managed Care – PPO | Source: Ambulatory Visit | Attending: Family Medicine | Admitting: Family Medicine

## 2022-11-09 DIAGNOSIS — H16233 Neurotrophic keratoconjunctivitis, bilateral: Secondary | ICD-10-CM | POA: Diagnosis not present

## 2022-11-09 DIAGNOSIS — K5732 Diverticulitis of large intestine without perforation or abscess without bleeding: Secondary | ICD-10-CM | POA: Diagnosis not present

## 2022-11-09 DIAGNOSIS — K921 Melena: Secondary | ICD-10-CM

## 2022-11-09 DIAGNOSIS — N289 Disorder of kidney and ureter, unspecified: Secondary | ICD-10-CM | POA: Diagnosis not present

## 2022-11-09 MED ORDER — IOPAMIDOL (ISOVUE-300) INJECTION 61%
500.0000 mL | Freq: Once | INTRAVENOUS | Status: AC | PRN
  Administered 2022-11-09: 85 mL via INTRAVENOUS

## 2022-11-10 ENCOUNTER — Ambulatory Visit: Payer: BC Managed Care – PPO | Admitting: Internal Medicine

## 2022-11-10 ENCOUNTER — Other Ambulatory Visit (INDEPENDENT_AMBULATORY_CARE_PROVIDER_SITE_OTHER): Payer: BC Managed Care – PPO

## 2022-11-10 VITALS — BP 142/86 | Wt 144.0 lb

## 2022-11-10 DIAGNOSIS — R197 Diarrhea, unspecified: Secondary | ICD-10-CM | POA: Diagnosis not present

## 2022-11-10 DIAGNOSIS — Z1211 Encounter for screening for malignant neoplasm of colon: Secondary | ICD-10-CM

## 2022-11-10 DIAGNOSIS — K625 Hemorrhage of anus and rectum: Secondary | ICD-10-CM

## 2022-11-10 LAB — COMPREHENSIVE METABOLIC PANEL
ALT: 19 U/L (ref 0–53)
AST: 18 U/L (ref 0–37)
Albumin: 3.8 g/dL (ref 3.5–5.2)
Alkaline Phosphatase: 97 U/L (ref 39–117)
BUN: 20 mg/dL (ref 6–23)
CO2: 28 meq/L (ref 19–32)
Calcium: 9.2 mg/dL (ref 8.4–10.5)
Chloride: 105 meq/L (ref 96–112)
Creatinine, Ser: 1.04 mg/dL (ref 0.40–1.50)
GFR: 77.06 mL/min (ref >60.00)
Glucose, Bld: 108 mg/dL — ABNORMAL HIGH (ref 70–99)
Potassium: 4.2 meq/L (ref 3.5–5.1)
Sodium: 138 meq/L (ref 135–145)
Total Bilirubin: 0.5 mg/dL (ref 0.2–1.2)
Total Protein: 7 g/dL (ref 6.0–8.3)

## 2022-11-10 LAB — CBC WITH DIFFERENTIAL/PLATELET
Basophils Absolute: 0.1 10*3/uL (ref 0.0–0.1)
Basophils Relative: 0.9 % (ref 0.0–3.0)
Eosinophils Absolute: 0.3 10*3/uL (ref 0.0–0.7)
Eosinophils Relative: 4.3 % (ref 0.0–5.0)
HCT: 39.5 % (ref 39.0–52.0)
Hemoglobin: 12.8 g/dL — ABNORMAL LOW (ref 13.0–17.0)
Lymphocytes Relative: 10 % — ABNORMAL LOW (ref 12.0–46.0)
Lymphs Abs: 0.8 10*3/uL (ref 0.7–4.0)
MCHC: 32.4 g/dL (ref 30.0–36.0)
MCV: 90.9 fL (ref 78.0–100.0)
Monocytes Absolute: 0.5 10*3/uL (ref 0.1–1.0)
Monocytes Relative: 6.3 % (ref 3.0–12.0)
Neutro Abs: 6.1 10*3/uL (ref 1.4–7.7)
Neutrophils Relative %: 78.5 % — ABNORMAL HIGH (ref 43.0–77.0)
Platelets: 277 10*3/uL (ref 150.0–400.0)
RBC: 4.35 Mil/uL (ref 4.22–5.81)
RDW: 13.5 % (ref 11.5–15.5)
WBC: 7.8 10*3/uL (ref 4.0–10.5)

## 2022-11-10 LAB — C-REACTIVE PROTEIN: CRP: <1 mg/dL (ref 0.5–20.0)

## 2022-11-10 MED ORDER — NA SULFATE-K SULFATE-MG SULF 17.5-3.13-1.6 GM/177ML PO SOLN: 1.0000 | Freq: Once | ORAL | 0 refills | Status: AC

## 2022-11-10 NOTE — Patient Instructions (Addendum)
You have been scheduled for a colonoscopy. Please follow written instructions given to you at your visit today.   Please pick up your prep supplies at the pharmacy within the next 1-3 days.  If you use inhalers (even only as needed), please bring them with you on the day of your procedure.  Your provider has requested that you go to the basement level for lab work before leaving today. Press "B" on the elevator. The lab is located at the first door on the left as you exit the elevator.   DO NOT TAKE 7 DAYS PRIOR TO TEST- Trulicity (dulaglutide) Ozempic, Wegovy (semaglutide) Mounjaro (tirzepatide) Bydureon Bcise (exanatide extended release)  DO NOT TAKE 1 DAY PRIOR TO YOUR TEST Rybelsus (semaglutide) Adlyxin (lixisenatide) Victoza (liraglutide) Byetta (exanatide) ___________________________________________________________________________   If your blood pressure at your visit was 140/90 or greater, please contact your primary care physician to follow up on this.  _______________________________________________________  If you are age 9 or older, your body mass index should be between 23-30. Your Body mass index is 23.6 kg/m. If this is out of the aforementioned range listed, please consider follow up with your Primary Care Provider.  If you are age 49 or younger, your body mass index should be between 19-25. Your Body mass index is 23.6 kg/m. If this is out of the aformentioned range listed, please consider follow up with your Primary Care Provider.    The [facility] providers would like to encourage you to use [facility] to communicate with providers for non-urgent requests or questions.  Due to long hold times on the telephone, sending your provider a message by [facility] may be a faster and more efficient way to get a response.  Please allow 48 business hours for a response.  Please remember that this is for non-urgent requests.   It was a pleasure to see you today!  Thank you for  trusting me with your gastrointestinal care!    Verna Czech , MD

## 2022-11-10 NOTE — Progress Notes (Signed)
Chief Complaint: Diarrhea, rectal bleeding  HPI : 62 year old male with history of gout, ADHD, allergic rhinitis, and asthma presents with diarrhea and rectal bleeding  He started developing diarrhea 6 weeks ago and currently has on average 6-8 BMs per day (he used to have 4-5 BMs per day on average). At first he was having nocturnal stools, but this has resolved. He is still having diarrhea though. He feels gaseous and unsettled in his stomach. He has some scant rectal bleeding on occasion that is bright red. Denies rectal pain. Denies ab pain, N&V, dysphagia, or acid reflux. His last colonoscopy was maybe 12 years ago. Daughter had a stress ulcer. Denies other family history of GI issues. Denies prior issues with his pancreas. He will use an Excedrin 1-2 times per month. Denies other NSAID use. He had a lot of stress 8 months ago and was particularly stressed this past summer. He has been around some people with COVID but denies other infectious exposures. Denies prior EGD. He is very physically active. He works as an Pensions consultant in Insurance claims handler. He still has his gallbladder. Denies issues with his pancreas in the past.   Past Medical History:  Diagnosis Date   ALLERGIC RHINITIS 09/11/2006   Qualifier: Diagnosis of  By: Tawanna Cooler RN, Alvino Chapel     ATTENTION DEFICIT DISORDER, ADULT 12/09/2008   Qualifier: Diagnosis of  By: Tawanna Cooler MD, Eugenio Hoes    Carbuncle and furuncle of buttock 11/20/2006   Qualifier: Diagnosis of  By: Tawanna Cooler MD, Eugenio Hoes    DYSPEPSIA 03/21/2008   Qualifier: Diagnosis of  By: Amador Cunas  MD, Janett Labella    Extrinsic asthma, unspecified 01/10/2008   Qualifier: Diagnosis of  By: Tawanna Cooler MD, Eugenio Hoes    GOUT 09/11/2006   Qualifier: Diagnosis of  By: Everett Graff     History of kidney stones    HYPERTENSION 09/11/2006   Qualifier: Diagnosis of  By: Tawanna Cooler RN, Lauretta Grill, HX OF 01/31/2008   Qualifier: Diagnosis of  By: Tawanna Cooler MD, Eugenio Hoes    PALPITATIONS, HX OF  03/21/2008   Qualifier: Diagnosis of  By: Amador Cunas  MD, Janett Labella    PILONIDAL CYST 01/31/2008   Qualifier: Diagnosis of  By: Tawanna Cooler MD, Eugenio Hoes    SEBACEOUS CYST, NECK 09/28/2006   Qualifier: Diagnosis of  By: Tawanna Cooler MD, Eugenio Hoes    VIRAL URI 01/05/2008   Qualifier: Diagnosis of  By: Tawanna Cooler MD, Eugenio Hoes    Past Surgical History:  Procedure Laterality Date   CLOSED REDUCTION NASAL FRACTURE Bilateral 07/25/2018   Procedure: CLOSED REDUCTION NASAL FRACTURE;  Surgeon: Christia Reading, MD;  Location: [facility] OR;  Service: ENT;  Laterality: Bilateral;   deviated septum repair     EXTRACORPOREAL SHOCK WAVE LITHOTRIPSY Left 10/25/2021   Procedure: LEFT EXTRACORPOREAL SHOCK WAVE LITHOTRIPSY (ESWL);  Surgeon: Marcine Matar, MD;  Location: [facility];  Service: Urology;  Laterality: Left;   nephrolithiasis     QUADRICEPS TENDON REPAIR     Partial meniscectomy   Family History  Problem Relation Age of Onset   Hypertension Other    Social History   Tobacco Use   Smoking status: Never   Smokeless tobacco: Never  Vaping Use   Vaping status: Never Used  Substance Use Topics   Alcohol use: Yes    Comment: ocassionally   Drug use: No   Current Outpatient Medications  Medication Sig Dispense Refill   allopurinol (  ZYLOPRIM) 300 MG tablet Take 1 tablet (300 mg total) by mouth daily. 90 tablet 3   aspirin EC 81 MG tablet Take by mouth.     Cenegermin-bkbj (OXERVATE) 0.002 % SOLN Apply 1 Drop/kg to eye 5 (five) times daily.     losartan-hydrochlorothiazide (HYZAAR) 50-12.5 MG tablet TAKE 1 TABLET BY MOUTH DAILY 90 tablet 0   fexofenadine (ALLEGRA) 180 MG tablet Take 180 mg by mouth daily. (Patient not taking: Reported on 11/10/2022)     oxyCODONE (ROXICODONE) 5 MG immediate release tablet Take 1 tablet (5 mg total) by mouth every 4 (four) hours as needed for severe pain or moderate pain. (Patient not taking: Reported on 11/10/2022) 10 tablet 0   No current facility-administered  medications for this visit.   No Known Allergies   Review of Systems: All systems reviewed and negative except where noted in HPI.   Physical Exam: BP (!) 142/86   Wt 144 lb (65.3 kg)   BMI 23.60 kg/m  Constitutional: Pleasant,well-developed, male in no acute distress. HEENT: Normocephalic and atraumatic. Conjunctivae are normal. No scleral icterus. Cardiovascular: Normal rate, regular rhythm.  Pulmonary/chest: Effort normal and breath sounds normal. No wheezing, rales or rhonchi. Abdominal: Soft, nondistended, nontender. Bowel sounds active throughout. There are no masses palpable. No hepatomegaly. Extremities: No edema Neurological: Alert and oriented to person place and time. Skin: Skin is warm and dry. No rashes noted. Psychiatric: Normal mood and affect. Behavior is normal.  Labs 09/2022: GI path panel negative. C dif negative. Fecal leukocytes present.  KUB 10/25/21: IMPRESSION: There is 15 mm calcific density in the midportion of left kidney suggesting renal stone.  ASSESSMENT AND PLAN: Colon cancer screening Diarrhea Rectal bleeding Patient presents with diarrhea and rectal bleeding that started about 6 weeks ago.  Prior to this he never really had any issues with bleeding.  He does note that an inciting factor for his symptoms may be stress.  He has already had an initial workup with stool studies that were negative for infection but positive for fecal leukocytes, which could suggest inflammation in his colon.  Will check his labs today and follow-up on his CT scan results that were ordered by his PCP.  Will also get him scheduled for a colonoscopy since patient is due for colon cancer screening.  His last colonoscopy was presumably 12 years ago. - Okay to use Imodium 2 mg PRN - Check CBC, CMP, CRP - Follow up CT A/P w/contrast results - Colonoscopy [facility]  Eulah Pont, MD   I spent 46 minutes of time, including in depth chart review, independent review of results as  outlined above, communicating results with the patient directly, face-to-face time with the patient, coordinating care, ordering studies and medications as appropriate, and documentation.

## 2022-11-17 ENCOUNTER — Other Ambulatory Visit: Payer: Self-pay | Admitting: Family Medicine

## 2022-11-17 DIAGNOSIS — N2889 Other specified disorders of kidney and ureter: Secondary | ICD-10-CM

## 2022-11-17 NOTE — Progress Notes (Signed)
His CT scan shows mild diverticulitis.  This is probably what was causing his issues with diarrhea.  He needs to follow-up with GI for this and may need a colonoscopy.  The radiologist also small a small cyst in his kidney.  They recommended that we get an MRI abdomen to further assess.  I will place this order.  He should let us know if they have not reached out about scheduling this within the next week or so.

## 2022-11-24 ENCOUNTER — Encounter: Payer: Self-pay | Admitting: Certified Registered Nurse Anesthetist

## 2022-11-25 ENCOUNTER — Ambulatory Visit: Payer: BC Managed Care – PPO | Admitting: Internal Medicine

## 2022-11-25 ENCOUNTER — Encounter: Payer: Self-pay | Admitting: Internal Medicine

## 2022-11-25 VITALS — BP 128/93 | HR 76 | Temp 97.5°F | Ht 65.5 in | Wt 144.0 lb

## 2022-11-25 MED ORDER — SODIUM CHLORIDE 0.9 % IV SOLN: 500.0000 mL | Freq: Once | INTRAVENOUS | Status: DC

## 2022-11-25 MED ORDER — AMOXICILLIN-POT CLAVULANATE 875-125 MG PO TABS: 1.0000 | ORAL_TABLET | Freq: Three times a day (TID) | ORAL | 0 refills | Status: AC

## 2022-11-25 NOTE — Progress Notes (Unsigned)
Patient presented for his colonoscopy procedure.  I reviewed his last CT scan that was done on 11/09/2022 that showed mild sigmoid diverticulitis.  I told the patient that doing a colonoscopy within 2 weeks of a diagnosis of diverticulitis would carry an increased risk of perforation.  Thus we will plan to delay his colonoscopy by at least another 4 weeks to allow him more time to recover from his diverticulitis.  He does continue to have some issues with diarrhea and rectal bleeding so will start on on a course of Augmentin 875 mg TID for 10 days to see if this helps with the symptoms.

## 2022-12-01 DIAGNOSIS — M19071 Primary osteoarthritis, right ankle and foot: Secondary | ICD-10-CM | POA: Diagnosis not present

## 2022-12-01 DIAGNOSIS — M25571 Pain in right ankle and joints of right foot: Secondary | ICD-10-CM | POA: Diagnosis not present

## 2022-12-05 ENCOUNTER — Other Ambulatory Visit: Payer: Self-pay | Admitting: Family Medicine

## 2022-12-13 ENCOUNTER — Other Ambulatory Visit: Payer: Self-pay | Admitting: Family Medicine

## 2022-12-13 DIAGNOSIS — N2889 Other specified disorders of kidney and ureter: Secondary | ICD-10-CM

## 2022-12-27 ENCOUNTER — Encounter: Payer: Self-pay | Admitting: Internal Medicine

## 2022-12-27 ENCOUNTER — Ambulatory Visit: Payer: BC Managed Care – PPO | Admitting: Internal Medicine

## 2022-12-27 VITALS — BP 97/71 | HR 79 | Temp 97.9°F | Resp 14 | Ht 65.5 in | Wt 144.0 lb

## 2022-12-27 DIAGNOSIS — D125 Benign neoplasm of sigmoid colon: Secondary | ICD-10-CM

## 2022-12-27 DIAGNOSIS — K648 Other hemorrhoids: Secondary | ICD-10-CM | POA: Diagnosis not present

## 2022-12-27 DIAGNOSIS — K5730 Diverticulosis of large intestine without perforation or abscess without bleeding: Secondary | ICD-10-CM

## 2022-12-27 DIAGNOSIS — D123 Benign neoplasm of transverse colon: Secondary | ICD-10-CM

## 2022-12-27 DIAGNOSIS — K6389 Other specified diseases of intestine: Secondary | ICD-10-CM | POA: Diagnosis not present

## 2022-12-27 DIAGNOSIS — R197 Diarrhea, unspecified: Secondary | ICD-10-CM

## 2022-12-27 DIAGNOSIS — K630 Abscess of intestine: Secondary | ICD-10-CM | POA: Diagnosis not present

## 2022-12-27 DIAGNOSIS — Z1211 Encounter for screening for malignant neoplasm of colon: Secondary | ICD-10-CM

## 2022-12-27 DIAGNOSIS — K5140 Inflammatory polyps of colon without complications: Secondary | ICD-10-CM | POA: Diagnosis not present

## 2022-12-27 MED ORDER — SODIUM CHLORIDE 0.9 % IV SOLN: 500.0000 mL | Freq: Once | INTRAVENOUS | Status: DC

## 2022-12-27 NOTE — Progress Notes (Signed)
To pacu, VSS. Report to RN.tb 

## 2022-12-27 NOTE — Progress Notes (Signed)
Called to room to assist during endoscopic procedure.  Patient ID and intended procedure confirmed with present staff. Received instructions for my participation in the procedure from the performing physician.  

## 2022-12-27 NOTE — Patient Instructions (Addendum)
Thank you for letting us care for you today! Please see handouts regarding Polyps and Diverticulosis.  YOU HAD AN ENDOSCOPIC PROCEDURE TODAY AT THE [facility]:   Refer to the procedure report that was given to you for any specific questions about what was found during the examination.  If the procedure report does not answer your questions, please call your gastroenterologist to clarify.  If you requested that your care partner not be given the details of your procedure findings, then the procedure report has been included in a sealed envelope for you to review at your convenience later.  YOU SHOULD EXPECT: Some feelings of bloating in the abdomen. Passage of more gas than usual.  Walking can help get rid of the air that was put into your GI tract during the procedure and reduce the bloating. If you had a lower endoscopy (such as a colonoscopy or flexible sigmoidoscopy) you may notice spotting of blood in your stool or on the toilet paper. If you underwent a bowel prep for your procedure, you may not have a normal bowel movement for a few days.  Please Note:  You might notice some irritation and congestion in your nose or some drainage.  This is from the oxygen used during your procedure.  There is no need for concern and it should clear up in a day or so.  SYMPTOMS TO REPORT IMMEDIATELY:  Following lower endoscopy (colonoscopy or flexible sigmoidoscopy):  Excessive amounts of blood in the stool  Significant tenderness or worsening of abdominal pains  Swelling of the abdomen that is new, acute  Fever of 100F or higher  For urgent or emergent issues, a gastroenterologist can be reached at any hour by calling [phone]. Do not use MyChart messaging for urgent concerns.    DIET:  We do recommend a small meal at first, but then you may proceed to your regular diet.  Drink plenty of fluids but you should avoid alcoholic beverages for 24 hours.  ACTIVITY:  You should plan to take  it easy for the rest of today and you should NOT DRIVE or use heavy machinery until tomorrow (because of the sedation medicines used during the test).    FOLLOW UP: Our staff will call the number listed on your records the next business day following your procedure.  We will call around 7:15- 8:00 am to check on you and address any questions or concerns that you may have regarding the information given to you following your procedure. If we do not reach you, we will leave a message.     If any biopsies were taken you will be contacted by phone or by letter within the next 1-3 weeks.  Please call us at [phone] if you have not heard about the biopsies in 3 weeks.    SIGNATURES/CONFIDENTIALITY: You and/or your care partner have signed paperwork which will be entered into your electronic medical record.  These signatures attest to the fact that that the information above on your After Visit Summary has been reviewed and is understood.  Full responsibility of the confidentiality of this discharge information lies with you and/or your care-partner.

## 2022-12-27 NOTE — Op Note (Signed)
[facility] Patient Name: Zachary Gutierrez Procedure Date: 12/27/2022 8:05 AM MRN: [id number] Endoscopist: Madelyn Brunner [place] , , [phone] Age: 62 Referring MD:  Date of Birth: 10/02/1960 Gender: Male Account #: [id number] Procedure:                Colonoscopy Indications:              Screening for colorectal malignant neoplasm,                            Incidental - Diarrhea, Incidental - Rectal                            bleeding, Incidental - Follow-up of diverticulitis Medicines:                Monitored Anesthesia Care Procedure:                Pre-Anesthesia Assessment:                           - Prior to the procedure, a History and Physical                            was performed, and patient medications and                            allergies were reviewed. The patient's tolerance of                            previous anesthesia was also reviewed. The risks                            and benefits of the procedure and the sedation                            options and risks were discussed with the patient.                            All questions were answered, and informed consent                            was obtained. Prior Anticoagulants: The patient has                            taken no anticoagulant or antiplatelet agents. ASA                            Grade Assessment: II - A patient with mild systemic                            disease. After reviewing the risks and benefits,                            the patient was deemed in satisfactory condition to  undergo the procedure.                           After obtaining informed consent, the colonoscope                            was passed under direct vision. Throughout the                            procedure, the patient's blood pressure, pulse, and                            oxygen saturations were monitored continuously. The                            Olympus Scope  SN [id number] was introduced through the                            anus and advanced to the the terminal ileum. The                            colonoscopy was performed without difficulty. The                            patient tolerated the procedure well. The quality                            of the bowel preparation was good. The terminal                            ileum, ileocecal valve, appendiceal orifice, and                            rectum were photographed. Scope In: 8:22:24 AM Scope Out: 8:46:27 AM Scope Withdrawal Time: 0 hours 18 minutes 55 seconds  Total Procedure Duration: 0 hours 24 minutes 3 seconds  Findings:                 The terminal ileum appeared normal.                           A 3 mm polyp was found in the transverse colon. The                            polyp was sessile. The polyp was removed with a                            cold snare. Resection and retrieval were complete.                           A few diverticula were found in the sigmoid colon,                            descending colon and transverse colon.  Localized inflammation characterized by congestion                            (edema), erosions, erythema, friability and loss of                            vascularity was found in the rectum, in the sigmoid                            colon and in the distal descending colon. Biopsies                            were taken of the inflamed area and the                            non-inflamed right coon with cold forceps for                            histology.                           A 3 mm polyp was found in the sigmoid colon. The                            polyp was sessile. The polyp was removed with a                            cold snare. Resection and retrieval were complete.                           Non-bleeding internal hemorrhoids were found during                            retroflexion. Complications:             No immediate complications. Estimated Blood Loss:     Estimated blood loss was minimal. Impression:               - The examined portion of the ileum was normal.                           - One 3 mm polyp in the transverse colon, removed                            with a cold snare. Resected and retrieved.                           - Diverticulosis in the sigmoid colon, in the                            descending colon and in the transverse colon.                           - Localized inflammation was found in the rectum,  in the sigmoid colon and in the distal descending                            colon. Biopsied.                           - One 3 mm polyp in the sigmoid colon, removed with                            a cold snare. Resected and retrieved.                           - Non-bleeding internal hemorrhoids. Recommendation:           - Discharge patient to home (with escort).                           - Await pathology results.                           - Return to GI clinic in 2-3 months.                           - The findings and recommendations were discussed                            with the patient. Dr Particia Lather "Alan Ripper" Leonides Schanz,  12/27/2022 8:56:38 AM

## 2022-12-27 NOTE — Progress Notes (Signed)
GASTROENTEROLOGY PROCEDURE H&P NOTE   Primary Care Physician: Ardith Dark, MD    Reason for Procedure:   Colon cancer screening, history of diverticulitis, diarrhea, and rectal bleeding  Plan:    Colonoscopy  Patient is appropriate for endoscopic procedure(s) in the ambulatory ([facility]) setting.  The nature of the procedure, as well as the risks, benefits, and alternatives were carefully and thoroughly reviewed with the patient. Ample time for discussion and questions allowed. The patient understood, was satisfied, and agreed to proceed.     HPI: Zachary Gutierrez is a 62 y.o. male who presents for colonoscopy for evaluation of colon cancer screening, diarrhea, rectal bleeding, and history of diverticulitis.  Patient was most recently seen in the Gastroenterology Clinic on 11/10/22.  Patient has had some improvement in his diarrhea and rectal bleeding after being treated with a 10 day course of Augmentin for diverticulitis. Please refer to that note for full details regarding GI history and clinical presentation.   Past Medical History:  Diagnosis Date   ALLERGIC RHINITIS 09/11/2006   Qualifier: Diagnosis of  By: Tawanna Cooler RN, Alvino Chapel     ATTENTION DEFICIT DISORDER, ADULT 12/09/2008   Qualifier: Diagnosis of  By: Tawanna Cooler MD, Eugenio Hoes    Carbuncle and furuncle of buttock 11/20/2006   Qualifier: Diagnosis of  By: Tawanna Cooler MD, Eugenio Hoes    DYSPEPSIA 03/21/2008   Qualifier: Diagnosis of  By: Amador Cunas  MD, Janett Labella    Extrinsic asthma, unspecified 01/10/2008   Qualifier: Diagnosis of  By: Tawanna Cooler MD, Eugenio Hoes    GOUT 09/11/2006   Qualifier: Diagnosis of  By: Everett Graff     History of kidney stones    HYPERTENSION 09/11/2006   Qualifier: Diagnosis of  By: Tawanna Cooler RN, Lauretta Grill, HX OF 01/31/2008   Qualifier: Diagnosis of  By: Tawanna Cooler MD, Eugenio Hoes    PALPITATIONS, HX OF 03/21/2008   Qualifier: Diagnosis of  By: Amador Cunas  MD, Janett Labella    PILONIDAL CYST 01/31/2008   Qualifier:  Diagnosis of  By: Tawanna Cooler MD, Eugenio Hoes    SEBACEOUS CYST, NECK 09/28/2006   Qualifier: Diagnosis of  By: Tawanna Cooler MD, Eugenio Hoes    VIRAL URI 01/05/2008   Qualifier: Diagnosis of  By: Tawanna Cooler MD, Eugenio Hoes     Past Surgical History:  Procedure Laterality Date   CLOSED REDUCTION NASAL FRACTURE Bilateral 07/25/2018   Procedure: CLOSED REDUCTION NASAL FRACTURE;  Surgeon: Christia Reading, MD;  Location: [facility] OR;  Service: ENT;  Laterality: Bilateral;   deviated septum repair     EXTRACORPOREAL SHOCK WAVE LITHOTRIPSY Left 10/25/2021   Procedure: LEFT EXTRACORPOREAL SHOCK WAVE LITHOTRIPSY (ESWL);  Surgeon: Marcine Matar, MD;  Location: [facility];  Service: Urology;  Laterality: Left;   nephrolithiasis     QUADRICEPS TENDON REPAIR     Partial meniscectomy    Prior to Admission medications   Medication Sig Start Date End Date Taking? Authorizing Provider  allopurinol (ZYLOPRIM) 300 MG tablet Take 1 tablet (300 mg total) by mouth daily. 08/09/21  Yes Ardith Dark, MD  aspirin EC 81 MG tablet Take by mouth.   Yes [provider]  Cenegermin-bkbj (OXERVATE) 0.002 % SOLN Apply 1 Drop/kg to eye 5 (five) times daily.   Yes [provider]  losartan-hydrochlorothiazide (HYZAAR) 50-12.5 MG tablet TAKE 1 TABLET BY MOUTH DAILY 12/05/22  Yes Ardith Dark, MD  fexofenadine (ALLEGRA) 180 MG tablet Take 180 mg by mouth daily.  Patient not taking: Reported on 11/10/2022    [provider]  oxyCODONE (ROXICODONE) 5 MG immediate release tablet Take 1 tablet (5 mg total) by mouth every 4 (four) hours as needed for severe pain or moderate pain. Patient not taking: Reported on 11/10/2022 10/25/21   Marcine Matar, MD    Current Outpatient Medications  Medication Sig Dispense Refill   allopurinol (ZYLOPRIM) 300 MG tablet Take 1 tablet (300 mg total) by mouth daily. 90 tablet 3   aspirin EC 81 MG tablet Take by mouth.     Cenegermin-bkbj (OXERVATE) 0.002 % SOLN Apply 1  Drop/kg to eye 5 (five) times daily.     losartan-hydrochlorothiazide (HYZAAR) 50-12.5 MG tablet TAKE 1 TABLET BY MOUTH DAILY 90 tablet 0   fexofenadine (ALLEGRA) 180 MG tablet Take 180 mg by mouth daily. (Patient not taking: Reported on 11/10/2022)     oxyCODONE (ROXICODONE) 5 MG immediate release tablet Take 1 tablet (5 mg total) by mouth every 4 (four) hours as needed for severe pain or moderate pain. (Patient not taking: Reported on 11/10/2022) 10 tablet 0   Current Facility-Administered Medications  Medication Dose Route Frequency Provider Last Rate Last Admin   0.9 %  sodium chloride infusion  500 mL Intravenous Once Imogene Burn, MD        Allergies as of 12/27/2022   (No Known Allergies)    Family History  Problem Relation Age of Onset   Hypertension Other    Colon cancer Neg Hx    Colon polyps Neg Hx    Esophageal cancer Neg Hx    Rectal cancer Neg Hx    Stomach cancer Neg Hx     Social History   Socioeconomic History   Marital status: Married    Spouse name: Not on file   Number of children: Not on file   Years of education: Not on file   Highest education level: Professional school degree (e.g., MD, DDS, DVM, JD)  Occupational History   Not on file  Tobacco Use   Smoking status: Never   Smokeless tobacco: Never  Vaping Use   Vaping status: Never Used  Substance and Sexual Activity   Alcohol use: Yes    Comment: ocassionally   Drug use: No   Sexual activity: Not on file  Other Topics Concern   Not on file  Social History Narrative   Not on file   Social Determinants of Health   Financial Resource Strain: Low Risk  (10/11/2022)   Overall Financial Resource Strain (CARDIA)    Difficulty of Paying Living Expenses: Not very hard  Food Insecurity: No Food Insecurity (10/11/2022)   Hunger Vital Sign    Worried About Running Out of Food in the Last Year: Never true    Ran Out of Food in the Last Year: Never true  Transportation Needs: No Transportation  Needs (10/11/2022)   PRAPARE - Administrator, Civil Service (Medical): No    Lack of Transportation (Non-Medical): No  Physical Activity: Sufficiently Active (10/11/2022)   Exercise Vital Sign    Days of Exercise per Week: 5 days    Minutes of Exercise per Session: 90 min  Stress: Stress Concern Present (10/11/2022)   [employer] of Occupational Health - Occupational Stress Questionnaire    Feeling of Stress : To some extent  Social Connections: Socially Integrated (10/11/2022)   Social Connection and Isolation Panel [NHANES]    Frequency of Communication with Friends and Family: More than three  times a week    Frequency of Social Gatherings with Friends and Family: More than three times a week    Attends Religious Services: More than 4 times per year    Active Member of Clubs or Organizations: Yes    Attends Engineer, structural: More than 4 times per year    Marital Status: Married  Catering manager Violence: Not on file    Physical Exam: Vital signs in last 24 hours: BP 123/89   Pulse 83   Temp 97.9 F (36.6 C) (Temporal)   Resp 16   Ht 5' 5.5" (1.664 m)   Wt 144 lb (65.3 kg)   SpO2 100%   BMI 23.60 kg/m  GEN: NAD EYE: Sclerae anicteric ENT: MMM CV: Non-tachycardic Pulm: No increased WOB GI: Soft NEURO:  Alert & Oriented   Eulah Pont, MD [facility]   12/27/2022 8:19 AM

## 2022-12-28 ENCOUNTER — Telehealth: Payer: Self-pay

## 2022-12-28 NOTE — Telephone Encounter (Signed)
  Follow up Call-     12/27/2022    7:41 AM 11/25/2022    3:12 PM  Call back number  Post procedure Call Back phone  # [phone]   Permission to leave phone message Yes Yes     Patient questions:  Do you have a fever, pain , or abdominal swelling? No. Pain Score  0 *  Have you tolerated food without any problems? Yes.    Have you been able to return to your normal activities? Yes.    Do you have any questions about your discharge instructions: Diet   No. Medications  No. Follow up visit  No.  Do you have questions or concerns about your Care? No.  Actions: * If pain score is 4 or above: No action needed, pain <4.

## 2022-12-29 ENCOUNTER — Telehealth: Payer: Self-pay | Admitting: Internal Medicine

## 2022-12-29 ENCOUNTER — Encounter: Payer: Self-pay | Admitting: Internal Medicine

## 2022-12-29 DIAGNOSIS — K51919 Ulcerative colitis, unspecified with unspecified complications: Secondary | ICD-10-CM

## 2022-12-29 LAB — SURGICAL PATHOLOGY

## 2022-12-29 MED ORDER — MESALAMINE 1.2 G PO TBEC: 4.8000 g | DELAYED_RELEASE_TABLET | Freq: Every day | ORAL | 2 refills | Status: DC

## 2022-12-29 NOTE — Telephone Encounter (Signed)
Spoke to the patient about the results of his pathology, which showed inflammation in the left side of the colon, favoring ulcerative colitis versus SCAD.  Because there is rectal involvement of the inflammation, I would favor that the patient has ulcerative colitis.  I offered the patient 2 options for treatment of his ulcerative colitis, either Rowasa enemas nightly versus a trial of oral mesalamine.  Rowasa enemas would likely be the most effective option based upon the location of his ulcerative colitis in the rectum and sigmoid colon predominantly.  However, if the patient wanted to try p.o. mesalamine first to see if it was effective, I would be willing to let him do so.  Patient states at this time he would prefer to to be started on oral mesalamine.  If this does not help with his symptoms, he will let me know and we will start him on Rowasa enemas.  I will send him Lialda 4.8 g daily.

## 2023-01-28 DIAGNOSIS — H16233 Neurotrophic keratoconjunctivitis, bilateral: Secondary | ICD-10-CM | POA: Diagnosis not present

## 2023-02-01 DIAGNOSIS — H16233 Neurotrophic keratoconjunctivitis, bilateral: Secondary | ICD-10-CM | POA: Diagnosis not present

## 2023-03-06 ENCOUNTER — Other Ambulatory Visit: Payer: Self-pay | Admitting: Family Medicine

## 2023-03-13 ENCOUNTER — Encounter: Payer: Self-pay | Admitting: Family Medicine

## 2023-03-14 ENCOUNTER — Other Ambulatory Visit: Payer: Self-pay | Admitting: Family Medicine

## 2023-03-14 NOTE — Telephone Encounter (Signed)
 The order is in. He needs to call to schedule.  Katina Degree. Jimmey Ralph, MD 03/14/2023 1:04 PM

## 2023-03-14 NOTE — Telephone Encounter (Signed)
**Note De-identified  [place] Obfuscation** Please advise 

## 2023-03-15 ENCOUNTER — Other Ambulatory Visit (INDEPENDENT_AMBULATORY_CARE_PROVIDER_SITE_OTHER): Payer: BC Managed Care – PPO

## 2023-03-15 ENCOUNTER — Encounter: Payer: Self-pay | Admitting: Physician Assistant

## 2023-03-15 ENCOUNTER — Ambulatory Visit (INDEPENDENT_AMBULATORY_CARE_PROVIDER_SITE_OTHER): Payer: BC Managed Care – PPO | Admitting: Physician Assistant

## 2023-03-15 VITALS — BP 126/84 | HR 72 | Ht 65.5 in | Wt 152.4 lb

## 2023-03-15 DIAGNOSIS — R93429 Abnormal radiologic findings on diagnostic imaging of unspecified kidney: Secondary | ICD-10-CM | POA: Diagnosis not present

## 2023-03-15 DIAGNOSIS — K51919 Ulcerative colitis, unspecified with unspecified complications: Secondary | ICD-10-CM

## 2023-03-15 LAB — CBC WITH DIFFERENTIAL/PLATELET
Basophils Absolute: 0 10*3/uL (ref 0.0–0.1)
Basophils Relative: 0.9 % (ref 0.0–3.0)
Eosinophils Absolute: 0.2 10*3/uL (ref 0.0–0.7)
Eosinophils Relative: 4.4 % (ref 0.0–5.0)
HCT: 40.7 % (ref 39.0–52.0)
Hemoglobin: 12.9 g/dL — ABNORMAL LOW (ref 13.0–17.0)
Lymphocytes Relative: 16.4 % (ref 12.0–46.0)
Lymphs Abs: 0.9 10*3/uL (ref 0.7–4.0)
MCHC: 31.7 g/dL (ref 30.0–36.0)
MCV: 79.8 fl (ref 78.0–100.0)
Monocytes Absolute: 0.6 10*3/uL (ref 0.1–1.0)
Monocytes Relative: 10.2 % (ref 3.0–12.0)
Neutro Abs: 3.7 10*3/uL (ref 1.4–7.7)
Neutrophils Relative %: 68.1 % (ref 43.0–77.0)
Platelets: 260 10*3/uL (ref 150.0–400.0)
RBC: 5.1 Mil/uL (ref 4.22–5.81)
RDW: 15.1 % (ref 11.5–15.5)
WBC: 5.4 10*3/uL (ref 4.0–10.5)

## 2023-03-15 LAB — COMPREHENSIVE METABOLIC PANEL
ALT: 15 U/L (ref 0–53)
AST: 18 U/L (ref 0–37)
Albumin: 4 g/dL (ref 3.5–5.2)
Alkaline Phosphatase: 86 U/L (ref 39–117)
BUN: 23 mg/dL (ref 6–23)
CO2: 24 meq/L (ref 19–32)
Calcium: 9 mg/dL (ref 8.4–10.5)
Chloride: 105 meq/L (ref 96–112)
Creatinine, Ser: 1.09 mg/dL (ref 0.40–1.50)
GFR: 72.66 mL/min (ref >60.00)
Glucose, Bld: 113 mg/dL — ABNORMAL HIGH (ref 70–99)
Potassium: 3.8 meq/L (ref 3.5–5.1)
Sodium: 138 meq/L (ref 135–145)
Total Bilirubin: 0.7 mg/dL (ref 0.2–1.2)
Total Protein: 7.2 g/dL (ref 6.0–8.3)

## 2023-03-15 MED ORDER — MESALAMINE 1.2 G PO TBEC: 4.8000 g | DELAYED_RELEASE_TABLET | Freq: Every day | ORAL | 11 refills | Status: AC

## 2023-03-15 NOTE — Progress Notes (Signed)
 I agree with the assessment and plan as outlined by Ms. Lemmon.

## 2023-03-15 NOTE — Patient Instructions (Addendum)
 _______________________________________________________  If your blood pressure at your visit was 140/90 or greater, please contact your primary care physician to follow up on this.  If you are age 63 or younger, your body mass index should be between 19-25. Your Body mass index is 24.97 kg/m. If this is out of the aformentioned range listed, please consider follow up with your Primary Care Provider.  ________________________________________________________  The [place] [facility] providers would like to encourage you to use [facility] to communicate with providers for non-urgent requests or questions.  Due to long hold times on the telephone, sending your provider a message by [facility] may be a faster and more efficient way to get a response.  Please allow 48 business hours for a response.  Please remember that this is for non-urgent requests.  _______________________________________________________  We have sent the following medications to your pharmacy for you to pick up at your convenience:  Lialda  Your provider has requested that you go to the basement level for lab work before leaving today. Press "B" on the elevator. The lab is located at the first door on the left as you exit the elevator.  You will be due for a flexsigmoidoscopy with Dr Leonides Schanz in June 2025.  We will contact you about getting this scheduled.  You will follow up in our office after procedure.  Please contact the radiologist about MRI scheduling.  Due to recent changes in healthcare laws, you may see the results of your imaging and laboratory studies on MyChart before your provider has had a chance to review them.  We understand that in some cases there may be results that are confusing or concerning to you. Not all laboratory results come back in the same time frame and the provider may be waiting for multiple results in order to interpret others.  Please give [place] 48 hours in order for your provider to thoroughly review all the  results before contacting the office for clarification of your results.   Thank you for entrusting me with your care and choosing [facility].  Hyacinth Meeker, PA-C

## 2023-03-15 NOTE — Progress Notes (Signed)
 Chief Complaint: Follow-up after colonoscopy  HPI:    Mr. Tallon is a 63 year old male with a past medical history as listed below, known to Dr. Leonides Schanz, who turns to clinic today for follow-up after colonoscopy.    11/10/2022 patient seen in clinic by Dr. Leonides Schanz for diarrhea Wels rectal bleeding and need for colon cancer screening.  Check CBC, CMP and CRP.  Ordered a colonoscopy and CT.    11/10/2022 CMP with a glucose of 108 and otherwise normal, CRP normal, CBC with a minimally decreased hemoglobin at 12.8 low as normal.    11/16/2022 CT abdomen pelvis with mild sigmoid diverticulitis, 8 mm indeterminate left renal lesion.  He was put on antibiotics for diverticulitis.    12/27/2022 colonoscopy done for screening as well as some diarrhea and bleeding with one 3 mm polyp in the transverse colon, diverticulosis in the sigmoid, descending and transverse colon as well as localized inflammation in the rectum, sigmoid colon and distal descending colon and one 3 mm polyp in the sigmoid colon nonbleeding internal hemorrhoids.  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  There was query of ulcerative colitis for segmental colitis associated with diverticulitis (scad).  At that point recommended patient have a repeat flex sigmoidoscopy in 6 months to determine if he was responding to therapy appropriately.    12/29/2022 telephone note from Dr. Leonides Schanz discussed that he had pathology which showed inflammation left side of the colon favoring ulcerative colitis for scad.  Discussed at that time that due to rectal involvement of the inflammation she would favor ulcerative colitis.  Discussed Rowasa enemas nightly versus a trial of oral mesalamine.  He wanted to start on Lialda 4.8 g daily.    Today, patient tells me he is  doing well on the Lialda 4.8 g daily.  Does describe 1 "flare" of symptoms where he had a little softer stool and some blood in his stool for about 2 to 3 days.  Apparently was going through a lot of stress at the time moving his daughter to [place] and had a lot of things going on at work, this only lasted for a few days and then went away completely.  In general he feels much better than when he first presented to clinic.    His daughter graduated from college and is a Administrator and lives in [place].    Denies fever, chills or weight loss.  Past Medical History:  Diagnosis Date   ALLERGIC RHINITIS 09/11/2006   Qualifier: Diagnosis of  By: Everett Graff     ATTENTION DEFICIT DISORDER, ADULT 12/09/2008   Qualifier: Diagnosis of  By: Tawanna Cooler MD, Eugenio Hoes    Carbuncle and furuncle of buttock 11/20/2006   Qualifier: Diagnosis of  By: Tawanna Cooler MD, Eugenio Hoes    DYSPEPSIA 03/21/2008   Qualifier: Diagnosis of  By: Amador Cunas  MD, Janett Labella    Extrinsic asthma, unspecified 01/10/2008   Qualifier: Diagnosis of  By: Tawanna Cooler MD, Eugenio Hoes    GOUT 09/11/2006   Qualifier: Diagnosis of  By: Everett Graff     History of kidney stones    HYPERTENSION 09/11/2006   Qualifier: Diagnosis of  By: Tawanna Cooler RN, Lauretta Grill, HX OF 01/31/2008   Qualifier: Diagnosis of  By: Tawanna Cooler MD, Eugenio Hoes    PALPITATIONS,  HX OF 03/21/2008   Qualifier: Diagnosis of  By: Amador Cunas  MD, Janett Labella    PILONIDAL CYST 01/31/2008   Qualifier: Diagnosis of  By: Tawanna Cooler MD, Eugenio Hoes    SEBACEOUS CYST, NECK 09/28/2006   Qualifier: Diagnosis of  By: Tawanna Cooler MD, Eugenio Hoes    VIRAL URI 01/05/2008   Qualifier: Diagnosis of  By: Tawanna Cooler MD, Eugenio Hoes     Past Surgical History:  Procedure Laterality Date   CLOSED REDUCTION NASAL FRACTURE Bilateral 07/25/2018   Procedure: CLOSED REDUCTION NASAL FRACTURE;  Surgeon: Christia Reading, MD;  Location: [facility] OR;  Service: ENT;  Laterality: Bilateral;   deviated septum repair     EXTRACORPOREAL  SHOCK WAVE LITHOTRIPSY Left 10/25/2021   Procedure: LEFT EXTRACORPOREAL SHOCK WAVE LITHOTRIPSY (ESWL);  Surgeon: Marcine Matar, MD;  Location: [facility];  Service: Urology;  Laterality: Left;   nephrolithiasis     QUADRICEPS TENDON REPAIR     Partial meniscectomy    Current Outpatient Medications  Medication Sig Dispense Refill   allopurinol (ZYLOPRIM) 300 MG tablet Take 1 tablet (300 mg total) by mouth daily. 90 tablet 3   aspirin EC 81 MG tablet Take by mouth.     fexofenadine (ALLEGRA) 180 MG tablet Take 180 mg by mouth daily.     losartan-hydrochlorothiazide (HYZAAR) 50-12.5 MG tablet TAKE 1 TABLET BY MOUTH DAILY 90 tablet 0   mesalamine (LIALDA) 1.2 g EC tablet Take 4 tablets (4.8 g total) by mouth daily with breakfast. 120 tablet 2   Cenegermin-bkbj (OXERVATE) 0.002 % SOLN Apply 1 Drop/kg to eye 5 (five) times daily. (Patient not taking: Reported on 03/15/2023)     oxyCODONE (ROXICODONE) 5 MG immediate release tablet Take 1 tablet (5 mg total) by mouth every 4 (four) hours as needed for severe pain or moderate pain. (Patient not taking: Reported on 03/15/2023) 10 tablet 0   No current facility-administered medications for this visit.    Allergies as of 03/15/2023   (No Known Allergies)    Family History  Problem Relation Age of Onset   Hypertension Other    Colon cancer Neg Hx    Colon polyps Neg Hx    Esophageal cancer Neg Hx    Rectal cancer Neg Hx    Stomach cancer Neg Hx     Social History   Socioeconomic History   Marital status: Married    Spouse name: Not on file   Number of children: Not on file   Years of education: Not on file   Highest education level: Professional school degree (e.g., MD, DDS, DVM, JD)  Occupational History   Not on file  Tobacco Use   Smoking status: Never   Smokeless tobacco: Never  Vaping Use   Vaping status: Never Used  Substance and Sexual Activity   Alcohol use: Yes    Comment: ocassionally   Drug use: No    Sexual activity: Not on file  Other Topics Concern   Not on file  Social History Narrative   Not on file   Social Drivers of Health   Financial Resource Strain: Low Risk  (10/11/2022)   Overall Financial Resource Strain (CARDIA)    Difficulty of Paying Living Expenses: Not very hard  Food Insecurity: No Food Insecurity (10/11/2022)   Hunger Vital Sign    Worried About Running Out of Food in the Last Year: Never true    Ran Out of Food in the Last Year: Never true  Transportation Needs: No Transportation  Needs (10/11/2022)   PRAPARE - Administrator, Civil Service (Medical): No    Lack of Transportation (Non-Medical): No  Physical Activity: Sufficiently Active (10/11/2022)   Exercise Vital Sign    Days of Exercise per Week: 5 days    Minutes of Exercise per Session: 90 min  Stress: Stress Concern Present (10/11/2022)   [employer] of Occupational Health - Occupational Stress Questionnaire    Feeling of Stress : To some extent  Social Connections: Socially Integrated (10/11/2022)   Social Connection and Isolation Panel [NHANES]    Frequency of Communication with Friends and Family: More than three times a week    Frequency of Social Gatherings with Friends and Family: More than three times a week    Attends Religious Services: More than 4 times per year    Active Member of [employer] or Organizations: Yes    Attends Engineer, structural: More than 4 times per year    Marital Status: Married  Catering manager Violence: Not on file    Review of Systems:    Constitutional: No weight loss, fever or chills Cardiovascular: No chest pain Respiratory: No SOB  Gastrointestinal: See HPI and otherwise negative   Physical Exam:  Vital signs: BP 126/84   Pulse 72   Ht 5' 5.5" (1.664 m)   Wt 152 lb 6 oz (69.1 kg)   BMI 24.97 kg/m    Constitutional:   Pleasant male appears to be in NAD, Well developed, Well nourished, alert and cooperative Respiratory:  Respirations even and unlabored. Lungs clear to auscultation bilaterally.   No wheezes, crackles, or rhonchi.  Cardiovascular: Normal S1, S2. No MRG. Regular rate and rhythm. No peripheral edema, cyanosis or pallor.  Gastrointestinal:  Soft, nondistended, nontender. No rebound or guarding. Normal bowel sounds. No appreciable masses or hepatomegaly. Rectal:  Not performed.  Psychiatric: Demonstrates good judgement and reason without abnormal affect or behaviors.  RELEVANT LABS AND IMAGING: CBC    Component Value Date/Time   WBC 7.8 11/10/2022 1140   RBC 4.35 11/10/2022 1140   HGB 12.8 (L) 11/10/2022 1140   HCT 39.5 11/10/2022 1140   PLT 277.0 11/10/2022 1140   MCV 90.9 11/10/2022 1140   MCH 30.9 07/25/2018 0947   MCHC 32.4 11/10/2022 1140   RDW 13.5 11/10/2022 1140   LYMPHSABS 0.8 11/10/2022 1140   MONOABS 0.5 11/10/2022 1140   EOSABS 0.3 11/10/2022 1140   BASOSABS 0.1 11/10/2022 1140    CMP     Component Value Date/Time   NA 138 11/10/2022 1140   K 4.2 11/10/2022 1140   CL 105 11/10/2022 1140   CO2 28 11/10/2022 1140   GLUCOSE 108 (H) 11/10/2022 1140   BUN 20 11/10/2022 1140   CREATININE 1.04 11/10/2022 1140   CALCIUM 9.2 11/10/2022 1140   PROT 7.0 11/10/2022 1140   ALBUMIN 3.8 11/10/2022 1140   AST 18 11/10/2022 1140   ALT 19 11/10/2022 1140   ALKPHOS 97 11/10/2022 1140   BILITOT 0.5 11/10/2022 1140   GFRNONAA >60 07/25/2018 0947   GFRAA >60 07/25/2018 0947    Assessment: 1.  Ulcerative colitis for scad: Colonoscopy in December with question of ulcerative colitis, started on oral Lialda 4.8 g daily after declining Rowasa enemas, has been doing well over the past 2 months with only 1 flare of symptoms, repeat flex sig recommended in 6 months 2.  Abnormal imaging of the kidney  Plan: 1.  Continue Lialda 4.8 g daily.  Sent in a  refill #120 with 5 refills 2.  Discussed with patient that if flares become more frequent or the Lialda is not controlling his symptoms then  would recommend we switch to Rowasa enemas as per recommendations by Dr. Leonides Schanz 3.  Patient was placed in recall for repeat flex sig in June of this year 4.  Repeat CBC and CMP today for medication monitoring 5.  Discussed patient's abnormal imaging of his kidney on CT from his PCP, they had already ordered an MRI but it does not look like he has followed through.  He is going to call radiology and set that appointment up 6.  Patient to follow in clinic per recommendations after time of flex sig  Hyacinth Meeker, PA-C [facility] 03/15/2023, 9:12 AM  Cc: Ardith Dark, MD

## 2023-04-07 DIAGNOSIS — H02885 Meibomian gland dysfunction left lower eyelid: Secondary | ICD-10-CM | POA: Diagnosis not present

## 2023-04-07 DIAGNOSIS — H02882 Meibomian gland dysfunction right lower eyelid: Secondary | ICD-10-CM | POA: Diagnosis not present

## 2023-04-07 DIAGNOSIS — H16233 Neurotrophic keratoconjunctivitis, bilateral: Secondary | ICD-10-CM | POA: Diagnosis not present

## 2023-04-19 ENCOUNTER — Other Ambulatory Visit: Payer: BC Managed Care – PPO

## 2023-05-01 ENCOUNTER — Encounter: Payer: Self-pay | Admitting: Family Medicine

## 2023-05-02 ENCOUNTER — Encounter: Payer: Self-pay | Admitting: Family Medicine

## 2023-05-03 DIAGNOSIS — H16233 Neurotrophic keratoconjunctivitis, bilateral: Secondary | ICD-10-CM | POA: Diagnosis not present

## 2023-05-03 DIAGNOSIS — H02885 Meibomian gland dysfunction left lower eyelid: Secondary | ICD-10-CM | POA: Diagnosis not present

## 2023-05-03 DIAGNOSIS — H02882 Meibomian gland dysfunction right lower eyelid: Secondary | ICD-10-CM | POA: Diagnosis not present

## 2023-05-14 ENCOUNTER — Other Ambulatory Visit

## 2023-06-02 ENCOUNTER — Encounter: Payer: Self-pay | Admitting: Family Medicine

## 2023-06-02 ENCOUNTER — Other Ambulatory Visit: Payer: Self-pay | Admitting: *Deleted

## 2023-06-02 MED ORDER — LOSARTAN POTASSIUM-HCTZ 50-12.5 MG PO TABS: 1.0000 | ORAL_TABLET | Freq: Every day | ORAL | 0 refills | Status: DC

## 2023-06-13 DIAGNOSIS — M19072 Primary osteoarthritis, left ankle and foot: Secondary | ICD-10-CM | POA: Diagnosis not present

## 2023-06-13 DIAGNOSIS — S86812A Strain of other muscle(s) and tendon(s) at lower leg level, left leg, initial encounter: Secondary | ICD-10-CM | POA: Diagnosis not present

## 2023-06-14 ENCOUNTER — Ambulatory Visit
Admission: RE | Admit: 2023-06-14 | Discharge: 2023-06-14 | Disposition: A | Discharge status: Home / Self Care | Source: Ambulatory Visit | Attending: Family Medicine | Admitting: Family Medicine

## 2023-06-14 DIAGNOSIS — N2889 Other specified disorders of kidney and ureter: Secondary | ICD-10-CM

## 2023-06-14 MED ORDER — GADOPICLENOL 0.5 MMOL/ML IV SOLN
7.0000 mL | Freq: Once | INTRAVENOUS | Status: AC | PRN
  Administered 2023-06-14: 7 mL via INTRAVENOUS

## 2023-06-15 ENCOUNTER — Ambulatory Visit: Payer: Self-pay | Admitting: Family Medicine

## 2023-06-15 DIAGNOSIS — N289 Disorder of kidney and ureter, unspecified: Secondary | ICD-10-CM

## 2023-06-15 NOTE — Progress Notes (Signed)
 His MRI shows a suspicious lesion in his left kidney.  The radiologist is concerned that this may represent a type of localized kidney cancer.  Recommend urgent referral to urology for further testing.  I will place the referral now though since he is already established with a urologist he can also call to schedule an appointment.  He should let us  know if they have not contacted him by next week.

## 2023-06-16 DIAGNOSIS — D49512 Neoplasm of unspecified behavior of left kidney: Secondary | ICD-10-CM | POA: Diagnosis not present

## 2023-06-16 DIAGNOSIS — Z87442 Personal history of urinary calculi: Secondary | ICD-10-CM | POA: Diagnosis not present

## 2023-09-06 ENCOUNTER — Other Ambulatory Visit: Payer: Self-pay | Admitting: *Deleted

## 2023-09-06 MED ORDER — LOSARTAN POTASSIUM-HCTZ 50-12.5 MG PO TABS: 1.0000 | ORAL_TABLET | Freq: Every day | ORAL | 0 refills | Status: DC

## 2023-09-11 ENCOUNTER — Encounter: Payer: Self-pay | Admitting: Family Medicine

## 2023-10-09 ENCOUNTER — Ambulatory Visit: Admitting: Family Medicine

## 2023-10-11 ENCOUNTER — Other Ambulatory Visit: Payer: Self-pay | Admitting: Family Medicine

## 2023-10-11 ENCOUNTER — Ambulatory Visit: Admitting: Family Medicine

## 2023-10-11 ENCOUNTER — Encounter: Payer: Self-pay | Admitting: Family Medicine

## 2023-10-11 VITALS — BP 128/83 | HR 72 | Temp 98.4°F | Ht 65.5 in | Wt 147.6 lb

## 2023-10-11 DIAGNOSIS — I10 Essential (primary) hypertension: Secondary | ICD-10-CM | POA: Diagnosis not present

## 2023-10-11 DIAGNOSIS — Z23 Encounter for immunization: Secondary | ICD-10-CM

## 2023-10-11 DIAGNOSIS — K51919 Ulcerative colitis, unspecified with unspecified complications: Secondary | ICD-10-CM | POA: Diagnosis not present

## 2023-10-11 DIAGNOSIS — N2889 Other specified disorders of kidney and ureter: Secondary | ICD-10-CM

## 2023-10-11 DIAGNOSIS — N200 Calculus of kidney: Secondary | ICD-10-CM

## 2023-10-11 DIAGNOSIS — K5190 Ulcerative colitis, unspecified, without complications: Secondary | ICD-10-CM | POA: Insufficient documentation

## 2023-10-11 MED ORDER — LOSARTAN POTASSIUM-HCTZ 50-12.5 MG PO TABS: 1.0000 | ORAL_TABLET | Freq: Every day | ORAL | 1 refills | Status: AC

## 2023-10-11 NOTE — Patient Instructions (Addendum)
 It was very nice to see you today!  I will refill your blood pressure medication today.  We gave your flu shot today.  Please come back soon for your physical.  Will check labs at that time.  Return for Annual Physical.   Take care, Dr Kennyth  PLEASE NOTE:  If you had any lab tests, please let us  know if you have not heard back within a few days. You may see your results on mychart before we have a chance to review them but we will give you a call once they are reviewed by us .   If we ordered any referrals today, please let us  know if you have not heard from their office within the next week.   If you had any urgent prescriptions sent in today, please check with the pharmacy within an hour of our visit to make sure the prescription was transmitted appropriately.   Please try these tips to maintain a healthy lifestyle:  Eat at least 3 REAL meals and 1-2 snacks per day.  Aim for no more than 5 hours between eating.  If you eat breakfast, please do so within one hour of getting up.   Each meal should contain half fruits/vegetables, one quarter protein, and one quarter carbs (no bigger than a computer mouse)  Cut down on sweet beverages. This includes juice, soda, and sweet tea.   Drink at least 1 glass of water with each meal and aim for at least 8 glasses per day  Exercise at least 150 minutes every week.

## 2023-10-11 NOTE — Assessment & Plan Note (Signed)
 Workup earlier this year showed a 8 mm indeterminate mass in his left kidney.  He has followed with urology since our last visit and they are continuing with watchful waiting.  He will likely have repeat scan at some point in the near future.

## 2023-10-11 NOTE — Progress Notes (Signed)
   Zachary Gutierrez is a 63 y.o. male who presents today for an office visit.  Assessment/Plan:  Chronic Problems Addressed Today: Ulcerative colitis (HCC) This was diagnosed last year by GI.  He will be following up with GI soon.  He is currently on Lialda  4.8 g daily.   Left renal mass Workup earlier this year showed a 8 mm indeterminate mass in his left kidney.  He has followed with urology since our last visit and they are continuing with watchful waiting.  He will likely have repeat scan at some point in the near future.  History of Urate kidney stone He is currently taking allopurinol  300 mg intermittently.  He is not sure if he needs to be on this daily.  Will recheck uric acid level when he comes back in for his physical in a few months and adjust dose of medication as needed at that time.  Flu vaccine given today.    Subjective:  HPI:  See assessment / plan for status of chronic conditions.  Patient is here today for follow-up.  I last saw him about a year or so ago.  Since our last visit he has been following GI for bloody diarrhea.  Underwent colonoscopy which did reveal concern for ulcerative colitis.  He has been on Lialda  4.8 g daily.  Still having intermittent bloody diarrhea and will follow-up with GI soon.  Since our last visit he also had a concerning left renal lesion.  He saw urology for this and they are continue with watchful waiting.  He needs a refill on his antihypertensives today.  He has been tolerating well.  He also notes that he has only been taking his allopurinol  intermittently as needed for his uric acid kidney stones.        Objective:  Physical Exam: BP 128/83   Pulse 72   Temp 98.4 F (36.9 C) (Temporal)   Ht 5' 5.5 (1.664 m)   Wt 147 lb 9.6 oz (67 kg)   SpO2 98%   BMI 24.19 kg/m   Gen: No acute distress, resting comfortably CV: Regular rate and rhythm with no murmurs appreciated Pulm: Normal work of breathing, clear to auscultation  bilaterally with no crackles, wheezes, or rhonchi Neuro: Grossly normal, moves all extremities Psych: Normal affect and thought content      Rieley Hausman M. Kennyth, MD 10/11/2023 8:51 AM

## 2023-10-11 NOTE — Assessment & Plan Note (Signed)
 This was diagnosed last year by GI.  He will be following up with GI soon.  He is currently on Lialda  4.8 g daily.

## 2023-10-11 NOTE — Assessment & Plan Note (Signed)
 He is currently taking allopurinol  300 mg intermittently.  He is not sure if he needs to be on this daily.  Will recheck uric acid level when he comes back in for his physical in a few months and adjust dose of medication as needed at that time.

## 2023-12-22 DIAGNOSIS — R051 Acute cough: Secondary | ICD-10-CM | POA: Diagnosis not present

## 2023-12-22 DIAGNOSIS — Z20822 Contact with and (suspected) exposure to covid-19: Secondary | ICD-10-CM | POA: Diagnosis not present

## 2023-12-22 DIAGNOSIS — J40 Bronchitis, not specified as acute or chronic: Secondary | ICD-10-CM | POA: Diagnosis not present
